# Patient Record
Sex: Female | Born: 1935 | Race: White | Hispanic: No | Marital: Married | Smoking: Former smoker
Health system: Southern US, Community
[De-identification: ages and names within clinical notes are randomized; demographics above are authoritative.]

## PROBLEM LIST (undated history)

## (undated) DIAGNOSIS — I714 Abdominal aortic aneurysm, without rupture, unspecified: Secondary | ICD-10-CM

## (undated) DIAGNOSIS — C3490 Malignant neoplasm of unspecified part of unspecified bronchus or lung: Secondary | ICD-10-CM

## (undated) DIAGNOSIS — I7090 Unspecified atherosclerosis: Secondary | ICD-10-CM

## (undated) DIAGNOSIS — E7800 Pure hypercholesterolemia, unspecified: Secondary | ICD-10-CM

## (undated) DIAGNOSIS — I10 Essential (primary) hypertension: Secondary | ICD-10-CM

## (undated) DIAGNOSIS — J449 Chronic obstructive pulmonary disease, unspecified: Secondary | ICD-10-CM

## (undated) DIAGNOSIS — M069 Rheumatoid arthritis, unspecified: Secondary | ICD-10-CM

## (undated) HISTORY — DX: Pure hypercholesterolemia, unspecified: E78.00

## (undated) HISTORY — PX: CHOLECYSTECTOMY: SHX55

## (undated) HISTORY — DX: Unspecified atherosclerosis: I70.90

## (undated) HISTORY — DX: Abdominal aortic aneurysm, without rupture, unspecified: I71.40

## (undated) HISTORY — DX: Rheumatoid arthritis, unspecified: M06.9

## (undated) HISTORY — DX: Chronic obstructive pulmonary disease, unspecified: J44.9

## (undated) HISTORY — PX: APPENDECTOMY: SHX54

## (undated) HISTORY — DX: Abdominal aortic aneurysm, without rupture: I71.4

---

## 1991-02-08 HISTORY — PX: RETINAL DETACHMENT SURGERY: SHX105

## 1998-03-30 ENCOUNTER — Encounter: Payer: Self-pay | Admitting: Internal Medicine

## 1998-03-30 ENCOUNTER — Ambulatory Visit (HOSPITAL_COMMUNITY): Admission: RE | Admit: 1998-03-30 | Discharge: 1998-03-30 | Payer: Self-pay | Admitting: Internal Medicine

## 1999-03-12 ENCOUNTER — Other Ambulatory Visit: Admission: RE | Admit: 1999-03-12 | Discharge: 1999-03-12 | Payer: Self-pay | Admitting: Internal Medicine

## 1999-04-19 ENCOUNTER — Ambulatory Visit (HOSPITAL_BASED_OUTPATIENT_CLINIC_OR_DEPARTMENT_OTHER): Admission: RE | Admit: 1999-04-19 | Discharge: 1999-04-19 | Payer: Self-pay | Admitting: Surgery

## 1999-05-31 ENCOUNTER — Encounter: Admission: RE | Admit: 1999-05-31 | Discharge: 1999-05-31 | Payer: Self-pay | Admitting: *Deleted

## 1999-08-16 ENCOUNTER — Ambulatory Visit (HOSPITAL_COMMUNITY): Admission: RE | Admit: 1999-08-16 | Discharge: 1999-08-16 | Payer: Self-pay

## 2000-01-19 ENCOUNTER — Ambulatory Visit (HOSPITAL_COMMUNITY): Admission: RE | Admit: 2000-01-19 | Discharge: 2000-01-19 | Payer: Self-pay | Admitting: Rheumatology

## 2000-01-19 ENCOUNTER — Encounter: Payer: Self-pay | Admitting: Rheumatology

## 2000-02-08 HISTORY — PX: OTHER SURGICAL HISTORY: SHX169

## 2000-02-18 ENCOUNTER — Ambulatory Visit (HOSPITAL_COMMUNITY): Admission: RE | Admit: 2000-02-18 | Discharge: 2000-02-19 | Payer: Self-pay

## 2000-02-18 ENCOUNTER — Encounter (INDEPENDENT_AMBULATORY_CARE_PROVIDER_SITE_OTHER): Payer: Self-pay | Admitting: Specialist

## 2000-12-25 ENCOUNTER — Encounter: Admission: RE | Admit: 2000-12-25 | Discharge: 2000-12-25 | Payer: Self-pay | Admitting: Neurosurgery

## 2000-12-25 ENCOUNTER — Encounter: Payer: Self-pay | Admitting: Neurosurgery

## 2002-02-07 HISTORY — PX: OTHER SURGICAL HISTORY: SHX169

## 2002-04-16 ENCOUNTER — Encounter: Payer: Self-pay | Admitting: Internal Medicine

## 2002-04-16 ENCOUNTER — Encounter: Admission: RE | Admit: 2002-04-16 | Discharge: 2002-04-16 | Payer: Self-pay | Admitting: Internal Medicine

## 2002-04-19 ENCOUNTER — Encounter: Payer: Self-pay | Admitting: Internal Medicine

## 2002-04-19 ENCOUNTER — Encounter: Admission: RE | Admit: 2002-04-19 | Discharge: 2002-04-19 | Payer: Self-pay | Admitting: Internal Medicine

## 2002-04-24 ENCOUNTER — Encounter: Payer: Self-pay | Admitting: Internal Medicine

## 2002-04-24 ENCOUNTER — Ambulatory Visit (HOSPITAL_COMMUNITY): Admission: RE | Admit: 2002-04-24 | Discharge: 2002-04-24 | Payer: Self-pay | Admitting: Internal Medicine

## 2002-06-05 ENCOUNTER — Encounter (INDEPENDENT_AMBULATORY_CARE_PROVIDER_SITE_OTHER): Payer: Self-pay | Admitting: *Deleted

## 2002-06-05 ENCOUNTER — Inpatient Hospital Stay (HOSPITAL_COMMUNITY): Admission: RE | Admit: 2002-06-05 | Discharge: 2002-06-11 | Payer: Self-pay | Admitting: Thoracic Surgery

## 2002-06-05 ENCOUNTER — Encounter: Payer: Self-pay | Admitting: Thoracic Surgery

## 2002-06-06 ENCOUNTER — Encounter: Payer: Self-pay | Admitting: Thoracic Surgery

## 2002-06-07 ENCOUNTER — Encounter: Payer: Self-pay | Admitting: Thoracic Surgery

## 2002-06-08 ENCOUNTER — Encounter: Payer: Self-pay | Admitting: Thoracic Surgery

## 2002-06-09 ENCOUNTER — Encounter: Payer: Self-pay | Admitting: Thoracic Surgery

## 2002-06-10 ENCOUNTER — Encounter: Payer: Self-pay | Admitting: Thoracic Surgery

## 2002-06-11 ENCOUNTER — Encounter: Payer: Self-pay | Admitting: Thoracic Surgery

## 2002-06-20 ENCOUNTER — Encounter: Payer: Self-pay | Admitting: Thoracic Surgery

## 2002-06-20 ENCOUNTER — Encounter: Admission: RE | Admit: 2002-06-20 | Discharge: 2002-06-20 | Payer: Self-pay | Admitting: Thoracic Surgery

## 2002-07-11 ENCOUNTER — Encounter: Payer: Self-pay | Admitting: Thoracic Surgery

## 2002-07-11 ENCOUNTER — Encounter: Admission: RE | Admit: 2002-07-11 | Discharge: 2002-07-11 | Payer: Self-pay | Admitting: Thoracic Surgery

## 2002-08-06 ENCOUNTER — Ambulatory Visit (HOSPITAL_COMMUNITY): Admission: RE | Admit: 2002-08-06 | Discharge: 2002-08-06 | Payer: Self-pay | Admitting: Thoracic Surgery

## 2002-08-06 ENCOUNTER — Encounter: Payer: Self-pay | Admitting: Thoracic Surgery

## 2002-08-09 ENCOUNTER — Encounter: Admission: RE | Admit: 2002-08-09 | Discharge: 2002-08-09 | Payer: Self-pay | Admitting: Thoracic Surgery

## 2002-08-09 ENCOUNTER — Encounter: Payer: Self-pay | Admitting: Thoracic Surgery

## 2002-10-10 ENCOUNTER — Encounter: Payer: Self-pay | Admitting: Thoracic Surgery

## 2002-10-10 ENCOUNTER — Encounter: Admission: RE | Admit: 2002-10-10 | Discharge: 2002-10-10 | Payer: Self-pay | Admitting: Thoracic Surgery

## 2002-12-11 ENCOUNTER — Encounter: Admission: RE | Admit: 2002-12-11 | Discharge: 2002-12-11 | Payer: Self-pay | Admitting: Thoracic Surgery

## 2003-01-23 ENCOUNTER — Encounter: Admission: RE | Admit: 2003-01-23 | Discharge: 2003-04-23 | Payer: Self-pay | Admitting: Internal Medicine

## 2003-04-10 ENCOUNTER — Encounter: Admission: RE | Admit: 2003-04-10 | Discharge: 2003-04-10 | Payer: Self-pay | Admitting: Thoracic Surgery

## 2003-09-11 ENCOUNTER — Encounter: Admission: RE | Admit: 2003-09-11 | Discharge: 2003-09-11 | Payer: Self-pay | Admitting: Thoracic Surgery

## 2003-09-23 ENCOUNTER — Encounter (INDEPENDENT_AMBULATORY_CARE_PROVIDER_SITE_OTHER): Payer: Self-pay | Admitting: *Deleted

## 2003-09-23 ENCOUNTER — Ambulatory Visit (HOSPITAL_COMMUNITY): Admission: RE | Admit: 2003-09-23 | Discharge: 2003-09-23 | Payer: Self-pay | Admitting: Thoracic Surgery

## 2004-03-16 ENCOUNTER — Encounter: Admission: RE | Admit: 2004-03-16 | Discharge: 2004-03-16 | Payer: Self-pay | Admitting: Thoracic Surgery

## 2004-06-06 ENCOUNTER — Encounter: Admission: RE | Admit: 2004-06-06 | Discharge: 2004-06-06 | Payer: Self-pay | Admitting: Neurosurgery

## 2004-09-15 ENCOUNTER — Encounter: Admission: RE | Admit: 2004-09-15 | Discharge: 2004-09-15 | Payer: Self-pay | Admitting: Thoracic Surgery

## 2005-03-22 ENCOUNTER — Encounter: Admission: RE | Admit: 2005-03-22 | Discharge: 2005-03-22 | Payer: Self-pay | Admitting: Thoracic Surgery

## 2005-09-21 ENCOUNTER — Encounter: Admission: RE | Admit: 2005-09-21 | Discharge: 2005-09-21 | Payer: Self-pay | Admitting: Thoracic Surgery

## 2006-04-04 ENCOUNTER — Encounter: Admission: RE | Admit: 2006-04-04 | Discharge: 2006-04-04 | Payer: Self-pay | Admitting: Thoracic Surgery

## 2006-04-04 ENCOUNTER — Ambulatory Visit: Payer: Self-pay | Admitting: Thoracic Surgery

## 2006-09-21 ENCOUNTER — Encounter: Admission: RE | Admit: 2006-09-21 | Discharge: 2006-09-21 | Payer: Self-pay | Admitting: Thoracic Surgery

## 2006-09-21 ENCOUNTER — Ambulatory Visit: Payer: Self-pay | Admitting: Thoracic Surgery

## 2007-03-29 ENCOUNTER — Ambulatory Visit: Payer: Self-pay | Admitting: Thoracic Surgery

## 2007-03-29 ENCOUNTER — Encounter: Admission: RE | Admit: 2007-03-29 | Discharge: 2007-03-29 | Payer: Self-pay | Admitting: Thoracic Surgery

## 2007-06-27 ENCOUNTER — Ambulatory Visit: Payer: Self-pay | Admitting: Thoracic Surgery

## 2007-06-27 ENCOUNTER — Encounter: Admission: RE | Admit: 2007-06-27 | Discharge: 2007-06-27 | Payer: Self-pay | Admitting: Thoracic Surgery

## 2007-12-11 ENCOUNTER — Encounter: Admission: RE | Admit: 2007-12-11 | Discharge: 2007-12-11 | Payer: Self-pay | Admitting: Thoracic Surgery

## 2007-12-11 ENCOUNTER — Ambulatory Visit: Payer: Self-pay | Admitting: Thoracic Surgery

## 2007-12-14 ENCOUNTER — Ambulatory Visit: Admission: RE | Admit: 2007-12-14 | Discharge: 2007-12-14 | Payer: Self-pay | Admitting: Thoracic Surgery

## 2007-12-19 ENCOUNTER — Ambulatory Visit: Payer: Self-pay | Admitting: Thoracic Surgery

## 2007-12-28 ENCOUNTER — Ambulatory Visit: Payer: Self-pay | Admitting: Thoracic Surgery

## 2007-12-28 ENCOUNTER — Ambulatory Visit (HOSPITAL_COMMUNITY): Admission: RE | Admit: 2007-12-28 | Discharge: 2007-12-28 | Payer: Self-pay | Admitting: Thoracic Surgery

## 2007-12-28 ENCOUNTER — Encounter: Payer: Self-pay | Admitting: Thoracic Surgery

## 2008-01-01 ENCOUNTER — Ambulatory Visit: Payer: Self-pay | Admitting: Internal Medicine

## 2008-01-01 ENCOUNTER — Ambulatory Visit: Payer: Self-pay | Admitting: Thoracic Surgery

## 2008-01-09 ENCOUNTER — Ambulatory Visit: Admission: RE | Admit: 2008-01-09 | Discharge: 2008-04-08 | Payer: Self-pay | Admitting: Radiation Oncology

## 2008-01-09 LAB — CBC WITH DIFFERENTIAL/PLATELET
BASO%: 0.6 % (ref 0.0–2.0)
Basophils Absolute: 0 10*3/uL (ref 0.0–0.1)
EOS%: 3.2 % (ref 0.0–7.0)
MCH: 29 pg (ref 26.0–34.0)
MCHC: 33.5 g/dL (ref 32.0–36.0)
MCV: 86.4 fL (ref 81.0–101.0)
MONO%: 7.2 % (ref 0.0–13.0)
RBC: 4.43 10*6/uL (ref 3.70–5.32)
RDW: 14.3 % (ref 11.3–14.5)
lymph#: 1.9 10*3/uL (ref 0.9–3.3)

## 2008-01-09 LAB — COMPREHENSIVE METABOLIC PANEL
ALT: 11 U/L (ref 0–35)
AST: 13 U/L (ref 0–37)
Albumin: 4.3 g/dL (ref 3.5–5.2)
Alkaline Phosphatase: 68 U/L (ref 39–117)
Potassium: 3.8 mEq/L (ref 3.5–5.3)
Sodium: 141 mEq/L (ref 135–145)
Total Bilirubin: 0.5 mg/dL (ref 0.3–1.2)
Total Protein: 6.6 g/dL (ref 6.0–8.3)

## 2008-01-14 LAB — CBC WITH DIFFERENTIAL/PLATELET
EOS%: 3 % (ref 0.0–7.0)
Eosinophils Absolute: 0.3 10*3/uL (ref 0.0–0.5)
LYMPH%: 21 % (ref 14.0–48.0)
MCH: 28.9 pg (ref 26.0–34.0)
MCV: 84.1 fL (ref 81.0–101.0)
MONO%: 7.2 % (ref 0.0–13.0)
NEUT#: 7 10*3/uL — ABNORMAL HIGH (ref 1.5–6.5)
Platelets: 328 10*3/uL (ref 145–400)
RBC: 4.6 10*6/uL (ref 3.70–5.32)
RDW: 13.9 % (ref 11.3–14.5)

## 2008-01-14 LAB — COMPREHENSIVE METABOLIC PANEL
AST: 11 U/L (ref 0–37)
Alkaline Phosphatase: 69 U/L (ref 39–117)
BUN: 28 mg/dL — ABNORMAL HIGH (ref 6–23)
Glucose, Bld: 128 mg/dL — ABNORMAL HIGH (ref 70–99)
Sodium: 139 mEq/L (ref 135–145)
Total Bilirubin: 0.5 mg/dL (ref 0.3–1.2)
Total Protein: 6.9 g/dL (ref 6.0–8.3)

## 2008-01-21 LAB — COMPREHENSIVE METABOLIC PANEL
Alkaline Phosphatase: 73 U/L (ref 39–117)
BUN: 43 mg/dL — ABNORMAL HIGH (ref 6–23)
Creatinine, Ser: 1.29 mg/dL — ABNORMAL HIGH (ref 0.40–1.20)
Glucose, Bld: 139 mg/dL — ABNORMAL HIGH (ref 70–99)
Total Bilirubin: 1.3 mg/dL — ABNORMAL HIGH (ref 0.3–1.2)

## 2008-01-21 LAB — CBC WITH DIFFERENTIAL/PLATELET
Basophils Absolute: 0 10*3/uL (ref 0.0–0.1)
Eosinophils Absolute: 0.1 10*3/uL (ref 0.0–0.5)
HGB: 12.4 g/dL (ref 11.6–15.9)
LYMPH%: 83.3 % — ABNORMAL HIGH (ref 14.0–48.0)
MCH: 29.5 pg (ref 26.0–34.0)
MCV: 85 fL (ref 81.0–101.0)
MONO%: 1.4 % (ref 0.0–13.0)
NEUT#: 0.1 10*3/uL — CL (ref 1.5–6.5)
Platelets: 123 10*3/uL — ABNORMAL LOW (ref 145–400)

## 2008-01-23 ENCOUNTER — Ambulatory Visit: Payer: Self-pay | Admitting: Thoracic Surgery

## 2008-01-23 ENCOUNTER — Encounter: Admission: RE | Admit: 2008-01-23 | Discharge: 2008-01-23 | Payer: Self-pay | Admitting: Thoracic Surgery

## 2008-01-28 LAB — COMPREHENSIVE METABOLIC PANEL
AST: 20 U/L (ref 0–37)
Albumin: 4 g/dL (ref 3.5–5.2)
Alkaline Phosphatase: 69 U/L (ref 39–117)
Glucose, Bld: 125 mg/dL — ABNORMAL HIGH (ref 70–99)
Potassium: 4.3 mEq/L (ref 3.5–5.3)
Sodium: 140 mEq/L (ref 135–145)
Total Protein: 6.7 g/dL (ref 6.0–8.3)

## 2008-01-28 LAB — CBC WITH DIFFERENTIAL/PLATELET
Eosinophils Absolute: 0.1 10*3/uL (ref 0.0–0.5)
MCV: 84.1 fL (ref 81.0–101.0)
MONO%: 5.2 % (ref 0.0–13.0)
NEUT#: 7.5 10*3/uL — ABNORMAL HIGH (ref 1.5–6.5)
RBC: 3.82 10*6/uL (ref 3.70–5.32)
RDW: 13.6 % (ref 11.3–14.5)
WBC: 11.3 10*3/uL — ABNORMAL HIGH (ref 3.9–10.0)

## 2008-02-04 LAB — COMPREHENSIVE METABOLIC PANEL
AST: 13 U/L (ref 0–37)
Alkaline Phosphatase: 66 U/L (ref 39–117)
BUN: 22 mg/dL (ref 6–23)
Creatinine, Ser: 1.28 mg/dL — ABNORMAL HIGH (ref 0.40–1.20)
Potassium: 4.4 mEq/L (ref 3.5–5.3)
Total Bilirubin: 0.4 mg/dL (ref 0.3–1.2)

## 2008-02-04 LAB — CBC WITH DIFFERENTIAL/PLATELET
Basophils Absolute: 0.1 10*3/uL (ref 0.0–0.1)
EOS%: 0.2 % (ref 0.0–7.0)
HCT: 30.1 % — ABNORMAL LOW (ref 34.8–46.6)
HGB: 10.6 g/dL — ABNORMAL LOW (ref 11.6–15.9)
MCH: 28.8 pg (ref 26.0–34.0)
MCV: 82 fL (ref 81.0–101.0)
MONO%: 8 % (ref 0.0–13.0)
NEUT%: 73.3 % (ref 39.6–76.8)
Platelets: 412 10*3/uL — ABNORMAL HIGH (ref 145–400)

## 2008-02-05 ENCOUNTER — Ambulatory Visit: Payer: Self-pay | Admitting: Internal Medicine

## 2008-02-05 ENCOUNTER — Inpatient Hospital Stay (HOSPITAL_COMMUNITY): Admission: AD | Admit: 2008-02-05 | Discharge: 2008-02-07 | Payer: Self-pay | Admitting: Internal Medicine

## 2008-02-05 ENCOUNTER — Ambulatory Visit: Payer: Self-pay | Admitting: Cardiology

## 2008-02-06 ENCOUNTER — Encounter: Payer: Self-pay | Admitting: Internal Medicine

## 2008-02-11 ENCOUNTER — Ambulatory Visit: Payer: Self-pay | Admitting: Internal Medicine

## 2008-02-11 LAB — CBC WITH DIFFERENTIAL/PLATELET
Eosinophils Absolute: 0 10*3/uL (ref 0.0–0.5)
MCV: 85.8 fL (ref 81.0–101.0)
MONO#: 0 10*3/uL — ABNORMAL LOW (ref 0.1–0.9)
MONO%: 0.2 % (ref 0.0–13.0)
NEUT#: 3.2 10*3/uL (ref 1.5–6.5)
RBC: 4.12 10*6/uL (ref 3.70–5.32)
RDW: 14.3 % (ref 11.3–14.5)
WBC: 3.9 10*3/uL (ref 3.9–10.0)
lymph#: 0.7 10*3/uL — ABNORMAL LOW (ref 0.9–3.3)

## 2008-02-11 LAB — COMPREHENSIVE METABOLIC PANEL
Albumin: 4.1 g/dL (ref 3.5–5.2)
BUN: 25 mg/dL — ABNORMAL HIGH (ref 6–23)
CO2: 31 mEq/L (ref 19–32)
Calcium: 9.4 mg/dL (ref 8.4–10.5)
Chloride: 100 mEq/L (ref 96–112)
Glucose, Bld: 126 mg/dL — ABNORMAL HIGH (ref 70–99)
Potassium: 4.2 mEq/L (ref 3.5–5.3)

## 2008-02-18 LAB — CBC WITH DIFFERENTIAL/PLATELET
Basophils Absolute: 0 10*3/uL (ref 0.0–0.1)
EOS%: 0.1 % (ref 0.0–7.0)
Eosinophils Absolute: 0 10*3/uL (ref 0.0–0.5)
HGB: 10.2 g/dL — ABNORMAL LOW (ref 11.6–15.9)
NEUT#: 14.9 10*3/uL — ABNORMAL HIGH (ref 1.5–6.5)
RBC: 3.46 10*6/uL — ABNORMAL LOW (ref 3.70–5.32)
RDW: 14 % (ref 11.3–14.5)
WBC: 18 10*3/uL — ABNORMAL HIGH (ref 3.9–10.0)
lymph#: 2 10*3/uL (ref 0.9–3.3)

## 2008-02-18 LAB — COMPREHENSIVE METABOLIC PANEL
AST: 14 U/L (ref 0–37)
Albumin: 4.3 g/dL (ref 3.5–5.2)
BUN: 23 mg/dL (ref 6–23)
Calcium: 8.8 mg/dL (ref 8.4–10.5)
Chloride: 99 mEq/L (ref 96–112)
Glucose, Bld: 127 mg/dL — ABNORMAL HIGH (ref 70–99)
Potassium: 3.8 mEq/L (ref 3.5–5.3)
Sodium: 143 mEq/L (ref 135–145)
Total Protein: 6.6 g/dL (ref 6.0–8.3)

## 2008-02-25 ENCOUNTER — Encounter (HOSPITAL_COMMUNITY): Admission: RE | Admit: 2008-02-25 | Discharge: 2008-05-25 | Payer: Self-pay | Admitting: Internal Medicine

## 2008-02-25 LAB — COMPREHENSIVE METABOLIC PANEL
ALT: 13 U/L (ref 0–35)
AST: 13 U/L (ref 0–37)
Albumin: 3.9 g/dL (ref 3.5–5.2)
Alkaline Phosphatase: 79 U/L (ref 39–117)
BUN: 18 mg/dL (ref 6–23)
Calcium: 9.2 mg/dL (ref 8.4–10.5)
Chloride: 93 mEq/L — ABNORMAL LOW (ref 96–112)
Potassium: 3.7 mEq/L (ref 3.5–5.3)
Sodium: 140 mEq/L (ref 135–145)
Total Protein: 6.8 g/dL (ref 6.0–8.3)

## 2008-02-25 LAB — CBC WITH DIFFERENTIAL/PLATELET
BASO%: 1.7 % (ref 0.0–2.0)
Basophils Absolute: 0.2 10*3/uL — ABNORMAL HIGH (ref 0.0–0.1)
EOS%: 0.1 % (ref 0.0–7.0)
HGB: 8.4 g/dL — ABNORMAL LOW (ref 11.6–15.9)
MCH: 29.1 pg (ref 26.0–34.0)
MCV: 83.5 fL (ref 81.0–101.0)
MONO%: 4.6 % (ref 0.0–13.0)
RBC: 2.88 10*6/uL — ABNORMAL LOW (ref 3.70–5.32)
RDW: 13.9 % (ref 11.3–14.5)
lymph#: 0.7 10*3/uL — ABNORMAL LOW (ref 0.9–3.3)

## 2008-03-04 ENCOUNTER — Emergency Department (HOSPITAL_COMMUNITY): Admission: EM | Admit: 2008-03-04 | Discharge: 2008-03-04 | Payer: Self-pay | Admitting: Emergency Medicine

## 2008-03-05 ENCOUNTER — Inpatient Hospital Stay (HOSPITAL_COMMUNITY): Admission: EM | Admit: 2008-03-05 | Discharge: 2008-03-14 | Payer: Self-pay

## 2008-03-06 ENCOUNTER — Ambulatory Visit: Payer: Self-pay | Admitting: Oncology

## 2008-03-18 LAB — CBC WITH DIFFERENTIAL/PLATELET
Basophils Absolute: 0 10*3/uL (ref 0.0–0.1)
Eosinophils Absolute: 0 10*3/uL (ref 0.0–0.5)
HGB: 8.7 g/dL — ABNORMAL LOW (ref 11.6–15.9)
MONO#: 0.5 10*3/uL (ref 0.1–0.9)
NEUT#: 1.6 10*3/uL (ref 1.5–6.5)
Platelets: 76 10*3/uL — ABNORMAL LOW (ref 145–400)
RBC: 2.79 10*6/uL — ABNORMAL LOW (ref 3.70–5.32)
RDW: 14.2 % (ref 11.3–14.5)
WBC: 3 10*3/uL — ABNORMAL LOW (ref 3.9–10.0)

## 2008-03-18 LAB — COMPREHENSIVE METABOLIC PANEL
Albumin: 3.1 g/dL — ABNORMAL LOW (ref 3.5–5.2)
BUN: 17 mg/dL (ref 6–23)
CO2: 37 mEq/L — ABNORMAL HIGH (ref 19–32)
Calcium: 9.8 mg/dL (ref 8.4–10.5)
Glucose, Bld: 123 mg/dL — ABNORMAL HIGH (ref 70–99)
Potassium: 3.3 mEq/L — ABNORMAL LOW (ref 3.5–5.3)
Sodium: 140 mEq/L (ref 135–145)
Total Protein: 6 g/dL (ref 6.0–8.3)

## 2008-03-24 LAB — COMPREHENSIVE METABOLIC PANEL
ALT: 18 U/L (ref 0–35)
BUN: 17 mg/dL (ref 6–23)
CO2: 34 mEq/L — ABNORMAL HIGH (ref 19–32)
Calcium: 10.2 mg/dL (ref 8.4–10.5)
Creatinine, Ser: 0.93 mg/dL (ref 0.40–1.20)
Total Bilirubin: 0.9 mg/dL (ref 0.3–1.2)

## 2008-03-24 LAB — CBC WITH DIFFERENTIAL/PLATELET
BASO%: 0.4 % (ref 0.0–2.0)
Basophils Absolute: 0 10*3/uL (ref 0.0–0.1)
HCT: 22.3 % — ABNORMAL LOW (ref 34.8–46.6)
HGB: 7.9 g/dL — ABNORMAL LOW (ref 11.6–15.9)
LYMPH%: 18.4 % (ref 14.0–48.0)
MCH: 31.4 pg (ref 26.0–34.0)
MCHC: 35.7 g/dL (ref 32.0–36.0)
MONO#: 0.5 10*3/uL (ref 0.1–0.9)
NEUT%: 69.3 % (ref 39.6–76.8)
Platelets: 263 10*3/uL (ref 145–400)
WBC: 4.2 10*3/uL (ref 3.9–10.0)

## 2008-03-24 LAB — TYPE & CROSSMATCH - CHCC

## 2008-03-31 ENCOUNTER — Ambulatory Visit: Payer: Self-pay | Admitting: Internal Medicine

## 2008-03-31 LAB — COMPREHENSIVE METABOLIC PANEL
Albumin: 3.8 g/dL (ref 3.5–5.2)
Alkaline Phosphatase: 71 U/L (ref 39–117)
BUN: 18 mg/dL (ref 6–23)
CO2: 31 mEq/L (ref 19–32)
Glucose, Bld: 110 mg/dL — ABNORMAL HIGH (ref 70–99)
Sodium: 139 mEq/L (ref 135–145)
Total Bilirubin: 1 mg/dL (ref 0.3–1.2)
Total Protein: 6.6 g/dL (ref 6.0–8.3)

## 2008-03-31 LAB — CBC WITH DIFFERENTIAL/PLATELET
Basophils Absolute: 0 10*3/uL (ref 0.0–0.1)
EOS%: 0.7 % (ref 0.0–7.0)
Eosinophils Absolute: 0 10*3/uL (ref 0.0–0.5)
HCT: 30 % — ABNORMAL LOW (ref 34.8–46.6)
HGB: 10.1 g/dL — ABNORMAL LOW (ref 11.6–15.9)
MCH: 29.7 pg (ref 25.1–34.0)
MCV: 88.2 fL (ref 79.5–101.0)
MONO%: 14.4 % — ABNORMAL HIGH (ref 0.0–14.0)
NEUT#: 4.6 10*3/uL (ref 1.5–6.5)
NEUT%: 76.7 % (ref 38.4–76.8)
Platelets: 136 10*3/uL — ABNORMAL LOW (ref 145–400)
RDW: 17.1 % — ABNORMAL HIGH (ref 11.2–14.5)

## 2008-04-02 ENCOUNTER — Ambulatory Visit: Payer: Self-pay | Admitting: Thoracic Surgery

## 2008-04-07 LAB — CBC WITH DIFFERENTIAL/PLATELET
BASO%: 1.1 % (ref 0.0–2.0)
LYMPH%: 60.8 % — ABNORMAL HIGH (ref 14.0–49.7)
MCHC: 35.8 g/dL (ref 31.5–36.0)
MONO#: 0 10*3/uL — ABNORMAL LOW (ref 0.1–0.9)
MONO%: 2.5 % (ref 0.0–14.0)
Platelets: 49 10*3/uL — ABNORMAL LOW (ref 145–400)
RBC: 2.6 10*6/uL — ABNORMAL LOW (ref 3.70–5.45)
WBC: 0.9 10*3/uL — CL (ref 3.9–10.3)

## 2008-04-07 LAB — COMPREHENSIVE METABOLIC PANEL
ALT: 17 U/L (ref 0–35)
AST: 17 U/L (ref 0–37)
Alkaline Phosphatase: 76 U/L (ref 39–117)
Calcium: 10 mg/dL (ref 8.4–10.5)
Chloride: 99 mEq/L (ref 96–112)
Creatinine, Ser: 0.83 mg/dL (ref 0.40–1.20)
Total Bilirubin: 1.8 mg/dL — ABNORMAL HIGH (ref 0.3–1.2)

## 2008-04-17 ENCOUNTER — Ambulatory Visit (HOSPITAL_COMMUNITY): Admission: RE | Admit: 2008-04-17 | Discharge: 2008-04-17 | Payer: Self-pay | Admitting: Internal Medicine

## 2008-04-21 ENCOUNTER — Ambulatory Visit: Payer: Self-pay | Admitting: Internal Medicine

## 2008-04-21 ENCOUNTER — Inpatient Hospital Stay (HOSPITAL_COMMUNITY): Admission: AD | Admit: 2008-04-21 | Discharge: 2008-04-22 | Payer: Self-pay | Admitting: Internal Medicine

## 2008-04-21 LAB — CBC WITH DIFFERENTIAL/PLATELET
BASO%: 0 % (ref 0.0–2.0)
EOS%: 0.5 % (ref 0.0–7.0)
HCT: 14.2 % — ABNORMAL LOW (ref 34.8–46.6)
LYMPH%: 16.4 % (ref 14.0–49.7)
MCH: 32.2 pg (ref 25.1–34.0)
MCHC: 35.4 g/dL (ref 31.5–36.0)
MCV: 91.1 fL (ref 79.5–101.0)
MONO%: 12.1 % (ref 0.0–14.0)
NEUT%: 71 % (ref 38.4–76.8)
Platelets: 157 10*3/uL (ref 145–400)

## 2008-04-21 LAB — COMPREHENSIVE METABOLIC PANEL
ALT: 9 U/L (ref 0–35)
AST: 12 U/L (ref 0–37)
BUN: 16 mg/dL (ref 6–23)
Creatinine, Ser: 0.83 mg/dL (ref 0.40–1.20)
Total Bilirubin: 0.6 mg/dL (ref 0.3–1.2)

## 2008-05-28 ENCOUNTER — Ambulatory Visit: Payer: Self-pay | Admitting: Thoracic Surgery

## 2008-06-16 ENCOUNTER — Ambulatory Visit: Payer: Self-pay | Admitting: Internal Medicine

## 2008-06-18 ENCOUNTER — Ambulatory Visit (HOSPITAL_COMMUNITY): Admission: RE | Admit: 2008-06-18 | Discharge: 2008-06-18 | Payer: Self-pay | Admitting: Internal Medicine

## 2008-06-18 LAB — COMPREHENSIVE METABOLIC PANEL
ALT: 16 U/L (ref 0–35)
Albumin: 3.6 g/dL (ref 3.5–5.2)
CO2: 33 mEq/L — ABNORMAL HIGH (ref 19–32)
Chloride: 102 mEq/L (ref 96–112)
Potassium: 3.9 mEq/L (ref 3.5–5.3)
Sodium: 141 mEq/L (ref 135–145)
Total Bilirubin: 0.5 mg/dL (ref 0.3–1.2)
Total Protein: 6.3 g/dL (ref 6.0–8.3)

## 2008-06-18 LAB — CBC WITH DIFFERENTIAL/PLATELET
BASO%: 0.3 % (ref 0.0–2.0)
LYMPH%: 20.5 % (ref 14.0–49.7)
MCHC: 34.8 g/dL (ref 31.5–36.0)
MONO#: 0.6 10*3/uL (ref 0.1–0.9)
NEUT#: 3.1 10*3/uL (ref 1.5–6.5)
RBC: 3.05 10*6/uL — ABNORMAL LOW (ref 3.70–5.45)
RDW: 14.2 % (ref 11.2–14.5)
WBC: 4.9 10*3/uL (ref 3.9–10.3)
lymph#: 1 10*3/uL (ref 0.9–3.3)

## 2008-06-21 ENCOUNTER — Ambulatory Visit: Payer: Self-pay | Admitting: Internal Medicine

## 2008-09-12 ENCOUNTER — Ambulatory Visit: Payer: Self-pay | Admitting: Internal Medicine

## 2008-09-16 ENCOUNTER — Ambulatory Visit (HOSPITAL_COMMUNITY): Admission: RE | Admit: 2008-09-16 | Discharge: 2008-09-16 | Payer: Self-pay | Admitting: Internal Medicine

## 2008-09-16 LAB — COMPREHENSIVE METABOLIC PANEL
ALT: 13 U/L (ref 0–35)
Albumin: 3.7 g/dL (ref 3.5–5.2)
CO2: 35 mEq/L — ABNORMAL HIGH (ref 19–32)
Calcium: 9.9 mg/dL (ref 8.4–10.5)
Chloride: 101 mEq/L (ref 96–112)
Glucose, Bld: 123 mg/dL — ABNORMAL HIGH (ref 70–99)
Sodium: 139 mEq/L (ref 135–145)
Total Protein: 6.5 g/dL (ref 6.0–8.3)

## 2008-09-16 LAB — CBC WITH DIFFERENTIAL/PLATELET
BASO%: 0.4 % (ref 0.0–2.0)
Eosinophils Absolute: 0.1 10*3/uL (ref 0.0–0.5)
HCT: 31.8 % — ABNORMAL LOW (ref 34.8–46.6)
MCHC: 34.5 g/dL (ref 31.5–36.0)
MONO#: 0.6 10*3/uL (ref 0.1–0.9)
NEUT#: 3.8 10*3/uL (ref 1.5–6.5)
Platelets: 162 10*3/uL (ref 145–400)
RBC: 3.39 10*6/uL — ABNORMAL LOW (ref 3.70–5.45)
WBC: 5.6 10*3/uL (ref 3.9–10.3)
lymph#: 1 10*3/uL (ref 0.9–3.3)

## 2008-09-17 ENCOUNTER — Ambulatory Visit: Payer: Self-pay | Admitting: Thoracic Surgery

## 2008-09-23 ENCOUNTER — Encounter: Payer: Self-pay | Admitting: Cardiology

## 2008-12-12 ENCOUNTER — Ambulatory Visit: Payer: Self-pay | Admitting: Internal Medicine

## 2008-12-16 ENCOUNTER — Ambulatory Visit (HOSPITAL_COMMUNITY): Admission: RE | Admit: 2008-12-16 | Discharge: 2008-12-16 | Payer: Self-pay | Admitting: Internal Medicine

## 2008-12-16 LAB — COMPREHENSIVE METABOLIC PANEL
ALT: 16 U/L (ref 0–35)
AST: 19 U/L (ref 0–37)
Albumin: 4 g/dL (ref 3.5–5.2)
Alkaline Phosphatase: 66 U/L (ref 39–117)
Calcium: 10.4 mg/dL (ref 8.4–10.5)
Chloride: 100 mEq/L (ref 96–112)
Potassium: 3.9 mEq/L (ref 3.5–5.3)
Sodium: 138 mEq/L (ref 135–145)
Total Protein: 6.9 g/dL (ref 6.0–8.3)

## 2008-12-16 LAB — CBC WITH DIFFERENTIAL/PLATELET
BASO%: 0.5 % (ref 0.0–2.0)
Basophils Absolute: 0 10*3/uL (ref 0.0–0.1)
EOS%: 1.8 % (ref 0.0–7.0)
HGB: 11.9 g/dL (ref 11.6–15.9)
MCH: 32.4 pg (ref 25.1–34.0)
MCHC: 34.1 g/dL (ref 31.5–36.0)
MONO%: 9.2 % (ref 0.0–14.0)
RBC: 3.68 10*6/uL — ABNORMAL LOW (ref 3.70–5.45)
RDW: 14.2 % (ref 11.2–14.5)
lymph#: 1.4 10*3/uL (ref 0.9–3.3)

## 2008-12-23 ENCOUNTER — Ambulatory Visit: Payer: Self-pay | Admitting: Thoracic Surgery

## 2008-12-24 ENCOUNTER — Encounter: Payer: Self-pay | Admitting: Cardiology

## 2009-04-17 ENCOUNTER — Ambulatory Visit: Payer: Self-pay | Admitting: Internal Medicine

## 2009-04-21 ENCOUNTER — Ambulatory Visit (HOSPITAL_COMMUNITY): Admission: RE | Admit: 2009-04-21 | Discharge: 2009-04-21 | Payer: Self-pay | Admitting: Internal Medicine

## 2009-04-21 LAB — COMPREHENSIVE METABOLIC PANEL
ALT: 15 U/L (ref 0–35)
AST: 17 U/L (ref 0–37)
Albumin: 3.9 g/dL (ref 3.5–5.2)
Alkaline Phosphatase: 71 U/L (ref 39–117)
Potassium: 3.6 mEq/L (ref 3.5–5.3)
Sodium: 143 mEq/L (ref 135–145)
Total Protein: 6.9 g/dL (ref 6.0–8.3)

## 2009-04-21 LAB — CBC WITH DIFFERENTIAL/PLATELET
BASO%: 0.4 % (ref 0.0–2.0)
Basophils Absolute: 0 10*3/uL (ref 0.0–0.1)
HCT: 37 % (ref 34.8–46.6)
HGB: 12.7 g/dL (ref 11.6–15.9)
MCHC: 34.3 g/dL (ref 31.5–36.0)
NEUT%: 65.7 % (ref 38.4–76.8)
Platelets: 182 10*3/uL (ref 145–400)
RDW: 14.1 % (ref 11.2–14.5)
lymph#: 1.3 10*3/uL (ref 0.9–3.3)

## 2009-04-24 ENCOUNTER — Ambulatory Visit: Payer: Self-pay | Admitting: Thoracic Surgery

## 2009-10-14 ENCOUNTER — Ambulatory Visit: Payer: Self-pay | Admitting: Internal Medicine

## 2009-10-19 ENCOUNTER — Ambulatory Visit (HOSPITAL_COMMUNITY): Admission: RE | Admit: 2009-10-19 | Discharge: 2009-10-19 | Payer: Self-pay | Admitting: Internal Medicine

## 2009-10-19 LAB — CBC WITH DIFFERENTIAL/PLATELET
BASO%: 0.5 % (ref 0.0–2.0)
Basophils Absolute: 0 10*3/uL (ref 0.0–0.1)
HGB: 12.4 g/dL (ref 11.6–15.9)
LYMPH%: 24.3 % (ref 14.0–49.7)
MCH: 30.6 pg (ref 25.1–34.0)
MCHC: 33.1 g/dL (ref 31.5–36.0)
MCV: 92.4 fL (ref 79.5–101.0)
NEUT%: 62.2 % (ref 38.4–76.8)
Platelets: 171 10*3/uL (ref 145–400)
WBC: 4.9 10*3/uL (ref 3.9–10.3)

## 2009-10-19 LAB — COMPREHENSIVE METABOLIC PANEL
ALT: 15 U/L (ref 0–35)
BUN: 22 mg/dL (ref 6–23)
CO2: 30 mEq/L (ref 19–32)
Calcium: 9.9 mg/dL (ref 8.4–10.5)
Creatinine, Ser: 0.94 mg/dL (ref 0.40–1.20)
Potassium: 3.8 mEq/L (ref 3.5–5.3)
Sodium: 141 mEq/L (ref 135–145)
Total Bilirubin: 0.8 mg/dL (ref 0.3–1.2)

## 2009-10-21 ENCOUNTER — Ambulatory Visit: Payer: Self-pay | Admitting: Thoracic Surgery

## 2009-11-20 IMAGING — CT CT CHEST W/O CM
2 of 3 series · 15 of 36 positions shown, 18 images · IV contrast (agent unspecified)
Comparison: 09/21/06.

CLINICAL DATA: Left upper lobectomy for lung cancer, followup. 
 CHEST CT WITHOUT CONTRAST:
TECHNIQUE: Multidetector CT imaging of the chest was performed following the standard protocol without IV contrast.

[Series 3: routine chest · axial · 0.70mm/px · z∈[-281,-36]mm · 12 of 59 slices shown, 15 images]
[im 5/59  mediastinal]
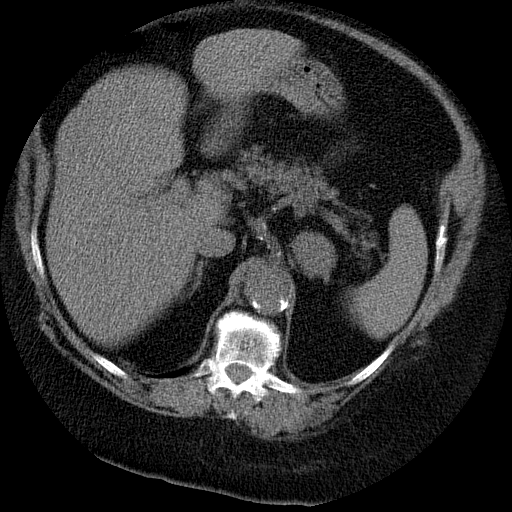
[im 5/59  lung]
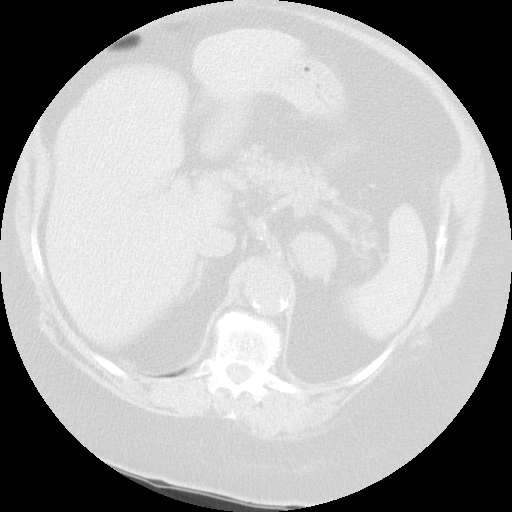
[im 9/59  lung]
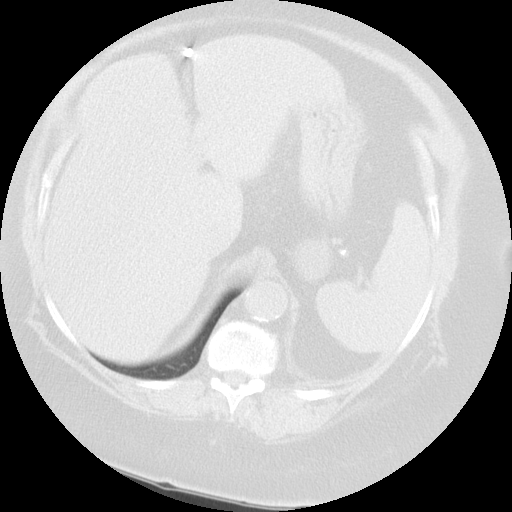
[im 13/59  lung]
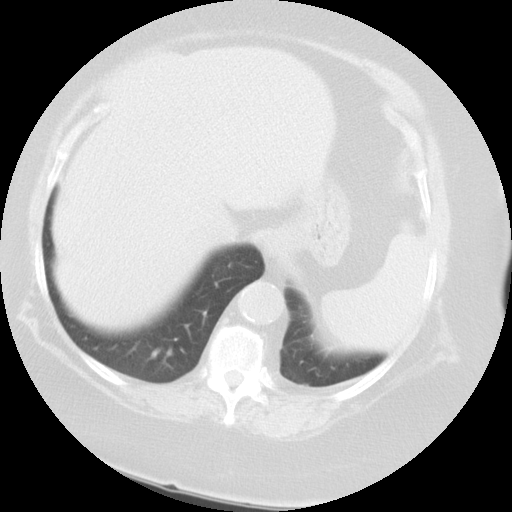
[im 18/59  lung]
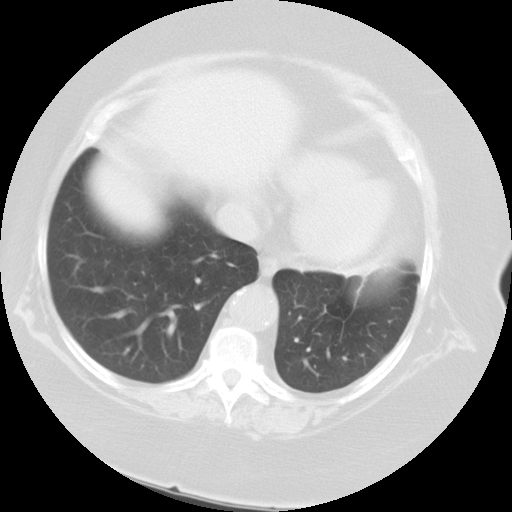
[im 22/59  mediastinal]
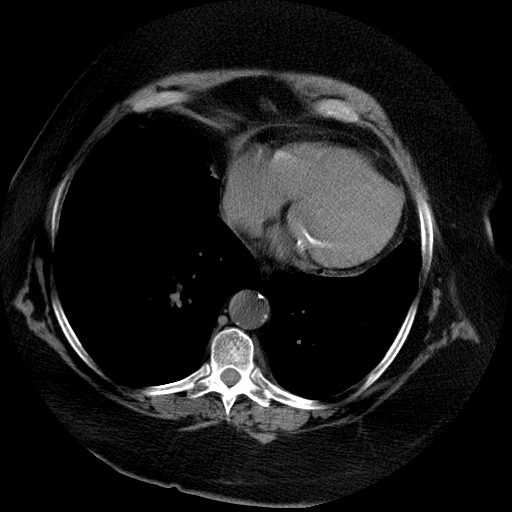
[im 22/59  lung]
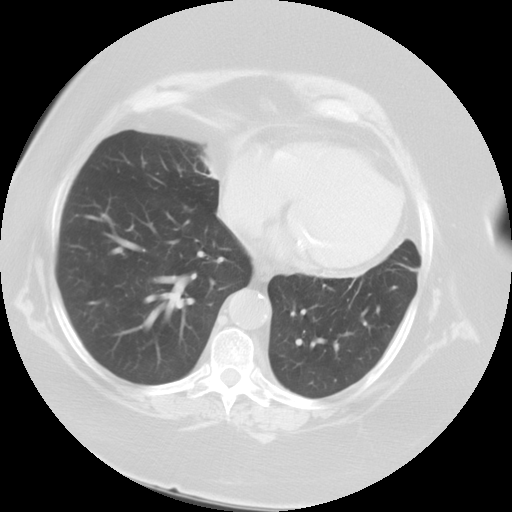
[im 26/59  lung]
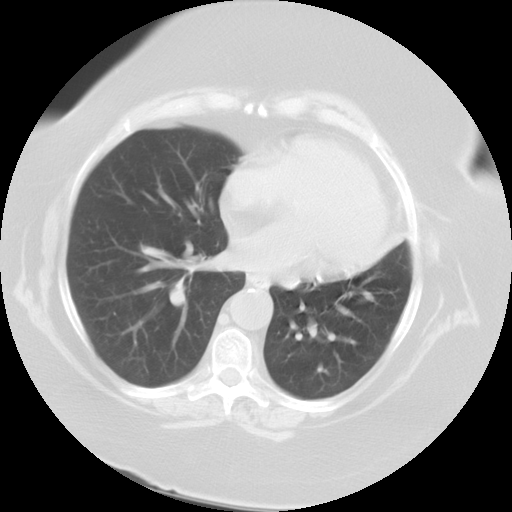
[im 33/59  lung]
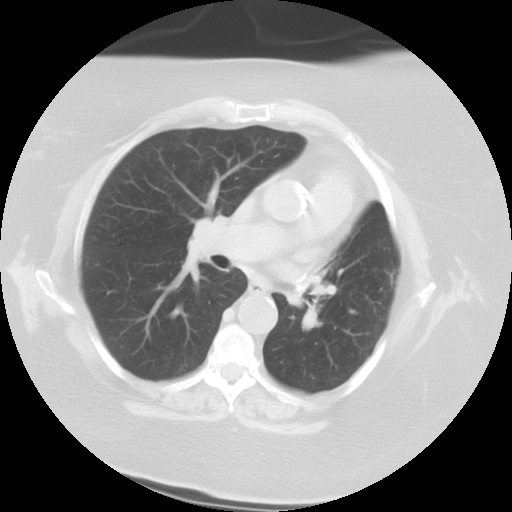
[im 37/59  lung]
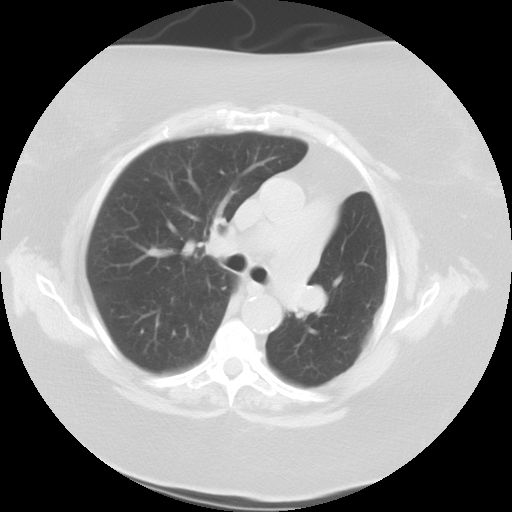
[im 41/59  mediastinal]
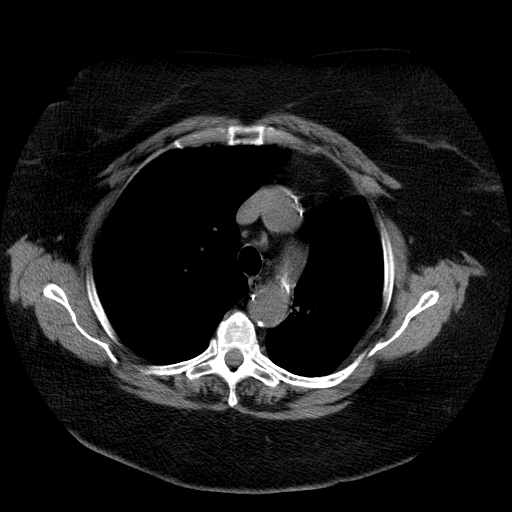
[im 41/59  lung]
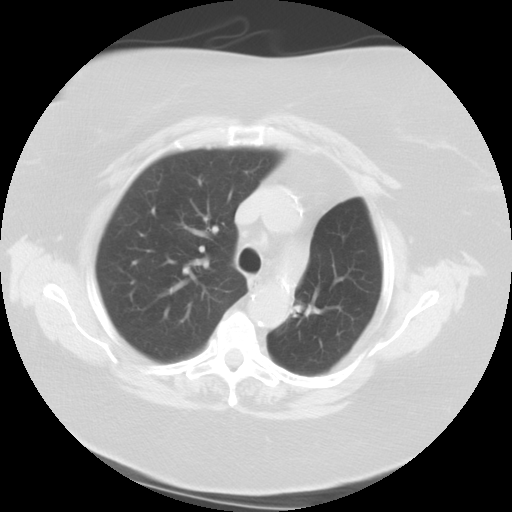
[im 46/59  lung]
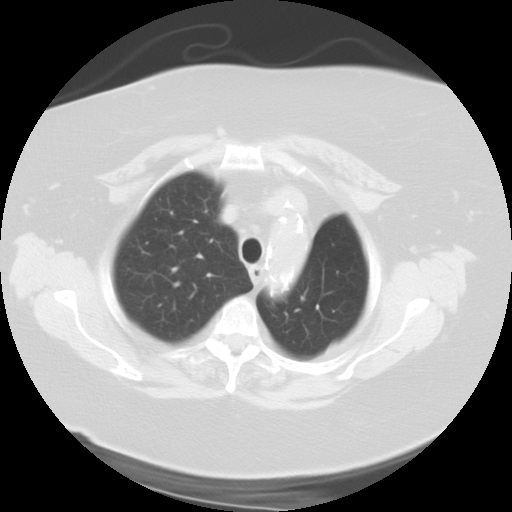
[im 50/59  lung]
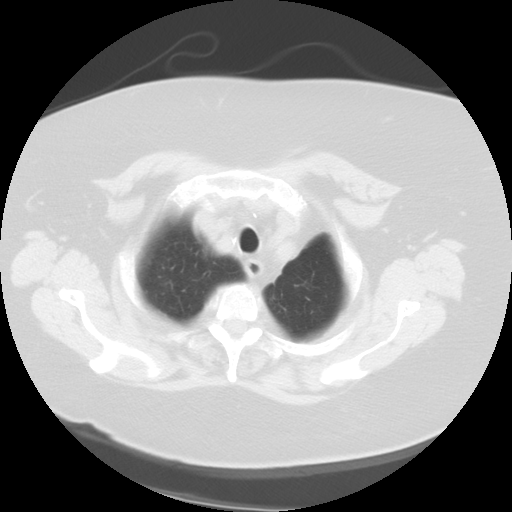
[im 54/59  lung]
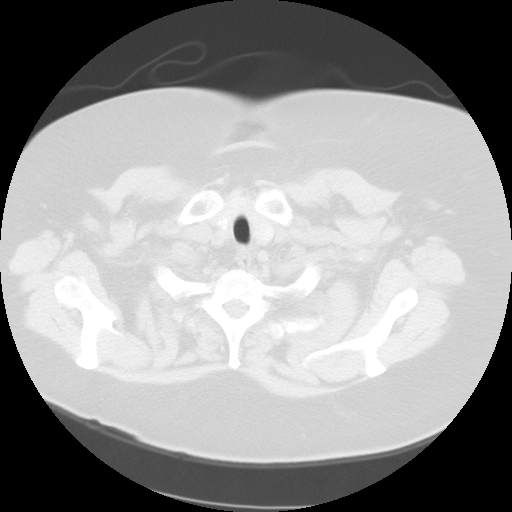

[Series 602: sagittal body · sagittal · 0.70mm/px · 3 of 145 slices shown]
[im 29/145  lung]
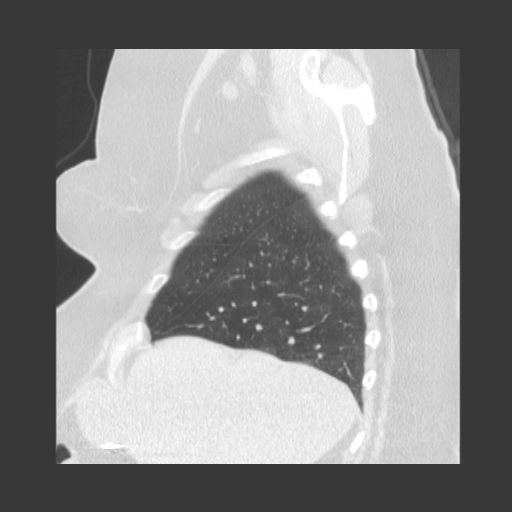
[im 58/145  lung]
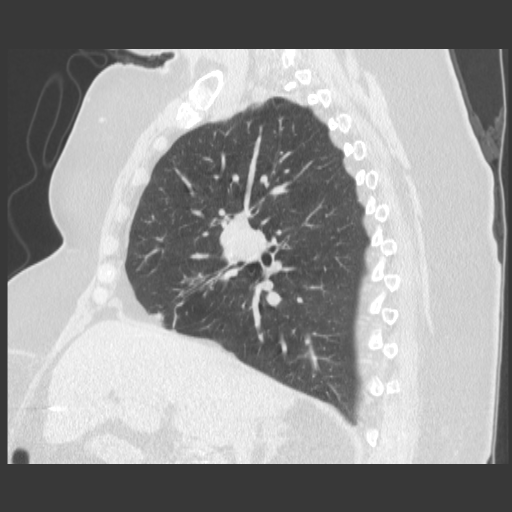
[im 87/145  lung]
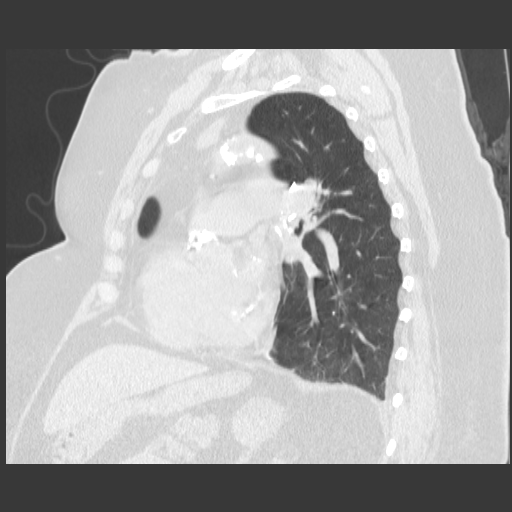

[15 of 36 positions shown; findings below may reference images not displayed]

FINDINGS: Thyroid gland remains inhomogeneous with internal calcification.  Atherosclerotic aortic and coronary arterial calcification noted.  Prominent pulmonary arteries again may suggest pulmonary arterial hypertension.  Left adrenal gland mass stable, measuring 3.8 x 3.1 cm.  No lymphadenopathy, pericardial or pleural effusion.  Right lower lobe 9 x 8 mm nodule on image 24, increased since the prior study, previously measuring approximately 3 mm.  Right lower lobe granuloma again noted image 31.  Prior left upper lobectomy changes again identified.
IMPRESSION: 9 mm right lower lobe pulmonary nodule.  This may be amenable to evaluation at PET CT although it is borderline in size for the expected threshold of possible visualization at PET CT (1 cm).

## 2010-02-26 ENCOUNTER — Other Ambulatory Visit: Payer: Self-pay | Admitting: Internal Medicine

## 2010-02-26 DIAGNOSIS — Z85118 Personal history of other malignant neoplasm of bronchus and lung: Secondary | ICD-10-CM

## 2010-02-28 ENCOUNTER — Encounter: Payer: Self-pay | Admitting: Internal Medicine

## 2010-04-15 ENCOUNTER — Encounter (HOSPITAL_COMMUNITY): Payer: Self-pay

## 2010-04-15 ENCOUNTER — Other Ambulatory Visit: Payer: Self-pay | Admitting: Internal Medicine

## 2010-04-15 ENCOUNTER — Other Ambulatory Visit (HOSPITAL_COMMUNITY): Payer: Self-pay

## 2010-04-15 ENCOUNTER — Encounter (HOSPITAL_BASED_OUTPATIENT_CLINIC_OR_DEPARTMENT_OTHER): Payer: Medicare Other | Admitting: Internal Medicine

## 2010-04-15 ENCOUNTER — Ambulatory Visit (HOSPITAL_COMMUNITY)
Admission: RE | Admit: 2010-04-15 | Discharge: 2010-04-15 | Disposition: A | Discharge status: Home / Self Care | Payer: Medicare Other | Source: Ambulatory Visit | Attending: Internal Medicine | Admitting: Internal Medicine

## 2010-04-15 DIAGNOSIS — Z85118 Personal history of other malignant neoplasm of bronchus and lung: Secondary | ICD-10-CM

## 2010-04-15 DIAGNOSIS — E278 Other specified disorders of adrenal gland: Secondary | ICD-10-CM | POA: Insufficient documentation

## 2010-04-15 DIAGNOSIS — C3430 Malignant neoplasm of lower lobe, unspecified bronchus or lung: Secondary | ICD-10-CM

## 2010-04-15 DIAGNOSIS — K863 Pseudocyst of pancreas: Secondary | ICD-10-CM | POA: Insufficient documentation

## 2010-04-15 DIAGNOSIS — C3490 Malignant neoplasm of unspecified part of unspecified bronchus or lung: Secondary | ICD-10-CM | POA: Insufficient documentation

## 2010-04-15 DIAGNOSIS — E0789 Other specified disorders of thyroid: Secondary | ICD-10-CM | POA: Insufficient documentation

## 2010-04-15 DIAGNOSIS — I77819 Aortic ectasia, unspecified site: Secondary | ICD-10-CM | POA: Insufficient documentation

## 2010-04-15 DIAGNOSIS — K862 Cyst of pancreas: Secondary | ICD-10-CM | POA: Insufficient documentation

## 2010-04-15 HISTORY — DX: Essential (primary) hypertension: I10

## 2010-04-15 HISTORY — DX: Malignant neoplasm of unspecified part of unspecified bronchus or lung: C34.90

## 2010-04-15 LAB — CBC WITH DIFFERENTIAL/PLATELET
Basophils Absolute: 0 10*3/uL (ref 0.0–0.1)
EOS%: 2.1 % (ref 0.0–7.0)
MCH: 30.7 pg (ref 25.1–34.0)
MCV: 92.1 fL (ref 79.5–101.0)
MONO%: 11.4 % (ref 0.0–14.0)
RBC: 3.61 10*6/uL — ABNORMAL LOW (ref 3.70–5.45)
RDW: 15.3 % — ABNORMAL HIGH (ref 11.2–14.5)

## 2010-04-15 LAB — CMP (CANCER CENTER ONLY)
AST: 17 U/L (ref 11–38)
Albumin: 3.6 g/dL (ref 3.3–5.5)
Alkaline Phosphatase: 57 U/L (ref 26–84)
BUN, Bld: 20 mg/dL (ref 7–22)
Potassium: 4.4 mEq/L (ref 3.3–4.7)
Sodium: 148 mEq/L — ABNORMAL HIGH (ref 128–145)

## 2010-04-15 MED ORDER — IOHEXOL 300 MG/ML  SOLN
80.0000 mL | Freq: Once | INTRAMUSCULAR | Status: AC | PRN
  Administered 2010-04-15: 80 mL via INTRAVENOUS

## 2010-04-21 ENCOUNTER — Ambulatory Visit (INDEPENDENT_AMBULATORY_CARE_PROVIDER_SITE_OTHER): Payer: Medicare Other | Admitting: Thoracic Surgery

## 2010-04-21 DIAGNOSIS — C3490 Malignant neoplasm of unspecified part of unspecified bronchus or lung: Secondary | ICD-10-CM

## 2010-04-21 NOTE — Assessment & Plan Note (Signed)
OFFICE VISIT  [name], [name] DOB:  18-Feb-1932                                        April 21, 2010 CHART #:  [id number]  [name] returns for followup of her lung cancer.  She has had both small cell and non-small-cell CT scan today, it showed no evidence of recurrence of either cancers, so she will see Dr. Shirline Frees, tomorrow. Her other problems were some pain in her foot and her left fifth toe and I suggested to see her medical doctor for this.  I will see her back again in 6 months with a CT scan.  Ines Bloomer, M.D. Electronically Signed  DPB/MEDQ  D:  04/21/2010  T:  04/21/2010  Job:  [id number]

## 2010-04-22 ENCOUNTER — Encounter (HOSPITAL_BASED_OUTPATIENT_CLINIC_OR_DEPARTMENT_OTHER): Payer: Medicare Other | Admitting: Internal Medicine

## 2010-04-22 ENCOUNTER — Other Ambulatory Visit: Payer: Self-pay | Admitting: Internal Medicine

## 2010-04-22 DIAGNOSIS — C3430 Malignant neoplasm of lower lobe, unspecified bronchus or lung: Secondary | ICD-10-CM

## 2010-04-22 DIAGNOSIS — Z85118 Personal history of other malignant neoplasm of bronchus and lung: Secondary | ICD-10-CM

## 2010-05-20 LAB — CROSSMATCH: Antibody Screen: NEGATIVE

## 2010-05-20 LAB — CBC
HCT: 30.3 % — ABNORMAL LOW (ref 36.0–46.0)
Hemoglobin: 10.3 g/dL — ABNORMAL LOW (ref 12.0–15.0)
MCHC: 34 g/dL (ref 30.0–36.0)
MCV: 91.4 fL (ref 78.0–100.0)
RDW: 14.5 % (ref 11.5–15.5)

## 2010-05-24 LAB — BASIC METABOLIC PANEL
BUN: 17 mg/dL (ref 6–23)
CO2: 31 mEq/L (ref 19–32)
CO2: 35 mEq/L — ABNORMAL HIGH (ref 19–32)
Calcium: 8.5 mg/dL (ref 8.4–10.5)
Calcium: 8.7 mg/dL (ref 8.4–10.5)
Calcium: 8.8 mg/dL (ref 8.4–10.5)
Chloride: 99 mEq/L (ref 96–112)
Creatinine, Ser: 0.98 mg/dL (ref 0.4–1.2)
Creatinine, Ser: 1.01 mg/dL (ref 0.4–1.2)
GFR calc Af Amer: >60 mL/min (ref >60)
GFR calc non Af Amer: 50 mL/min — ABNORMAL LOW (ref >60)
GFR calc non Af Amer: 56 mL/min — ABNORMAL LOW (ref >60)
Glucose, Bld: 101 mg/dL — ABNORMAL HIGH (ref 70–99)
Glucose, Bld: 109 mg/dL — ABNORMAL HIGH (ref 70–99)
Glucose, Bld: 124 mg/dL — ABNORMAL HIGH (ref 70–99)
Glucose, Bld: 151 mg/dL — ABNORMAL HIGH (ref 70–99)
Potassium: 4 mEq/L (ref 3.5–5.1)
Sodium: 136 mEq/L (ref 135–145)
Sodium: 141 mEq/L (ref 135–145)

## 2010-05-24 LAB — CROSSMATCH
ABO/RH(D): A POS
ABO/RH(D): A POS
ABO/RH(D): A POS
Antibody Screen: NEGATIVE
Antibody Screen: NEGATIVE
Antibody Screen: NEGATIVE

## 2010-05-24 LAB — URINALYSIS, ROUTINE W REFLEX MICROSCOPIC
Bilirubin Urine: NEGATIVE
Glucose, UA: NEGATIVE mg/dL
Hgb urine dipstick: NEGATIVE
Specific Gravity, Urine: 1.017 (ref 1.005–1.030)

## 2010-05-24 LAB — GLUCOSE, CAPILLARY
Glucose-Capillary: 101 mg/dL — ABNORMAL HIGH (ref 70–99)
Glucose-Capillary: 102 mg/dL — ABNORMAL HIGH (ref 70–99)
Glucose-Capillary: 103 mg/dL — ABNORMAL HIGH (ref 70–99)
Glucose-Capillary: 109 mg/dL — ABNORMAL HIGH (ref 70–99)
Glucose-Capillary: 111 mg/dL — ABNORMAL HIGH (ref 70–99)
Glucose-Capillary: 135 mg/dL — ABNORMAL HIGH (ref 70–99)
Glucose-Capillary: 148 mg/dL — ABNORMAL HIGH (ref 70–99)
Glucose-Capillary: 92 mg/dL (ref 70–99)
Glucose-Capillary: 93 mg/dL (ref 70–99)

## 2010-05-24 LAB — DIFFERENTIAL
Basophils Absolute: 0 10*3/uL (ref 0.0–0.1)
Basophils Absolute: 0 10*3/uL (ref 0.0–0.1)
Basophils Absolute: 0 10*3/uL (ref 0.0–0.1)
Basophils Relative: 1 % (ref 0–1)
Eosinophils Absolute: 0 10*3/uL (ref 0.0–0.7)
Lymphocytes Relative: 0 % — ABNORMAL LOW (ref 12–46)
Lymphocytes Relative: 0 % — ABNORMAL LOW (ref 12–46)
Lymphocytes Relative: 52 % — ABNORMAL HIGH (ref 12–46)
Lymphs Abs: 0 10*3/uL — ABNORMAL LOW (ref 0.7–4.0)
Lymphs Abs: 0 10*3/uL — ABNORMAL LOW (ref 0.7–4.0)
Lymphs Abs: 0.4 10*3/uL — ABNORMAL LOW (ref 0.7–4.0)
Monocytes Absolute: 0 10*3/uL — ABNORMAL LOW (ref 0.1–1.0)
Monocytes Absolute: 0 10*3/uL — ABNORMAL LOW (ref 0.1–1.0)
Monocytes Absolute: 0 10*3/uL — ABNORMAL LOW (ref 0.1–1.0)
Neutro Abs: 0 10*3/uL — ABNORMAL LOW (ref 1.7–7.7)
Neutro Abs: 0.3 10*3/uL — ABNORMAL LOW (ref 1.7–7.7)
Neutrophils Relative %: 0 % — ABNORMAL LOW (ref 43–77)
Neutrophils Relative %: 0 % — ABNORMAL LOW (ref 43–77)

## 2010-05-24 LAB — URINALYSIS, MICROSCOPIC ONLY
Glucose, UA: NEGATIVE mg/dL
Ketones, ur: 15 mg/dL — AB
Protein, ur: 100 mg/dL — AB
pH: 6.5 (ref 5.0–8.0)

## 2010-05-24 LAB — COMPREHENSIVE METABOLIC PANEL
ALT: 18 U/L (ref 0–35)
AST: 17 U/L (ref 0–37)
AST: 18 U/L (ref 0–37)
Albumin: 3.1 g/dL — ABNORMAL LOW (ref 3.5–5.2)
Albumin: 3.4 g/dL — ABNORMAL LOW (ref 3.5–5.2)
Alkaline Phosphatase: 67 U/L (ref 39–117)
BUN: 36 mg/dL — ABNORMAL HIGH (ref 6–23)
CO2: 27 mEq/L (ref 19–32)
Chloride: 94 mEq/L — ABNORMAL LOW (ref 96–112)
Chloride: 95 mEq/L — ABNORMAL LOW (ref 96–112)
Creatinine, Ser: 1.09 mg/dL (ref 0.4–1.2)
Creatinine, Ser: 1.27 mg/dL — ABNORMAL HIGH (ref 0.4–1.2)
GFR calc Af Amer: 50 mL/min — ABNORMAL LOW (ref >60)
GFR calc Af Amer: 60 mL/min — ABNORMAL LOW (ref >60)
GFR calc non Af Amer: 41 mL/min — ABNORMAL LOW (ref >60)
Potassium: 3.8 mEq/L (ref 3.5–5.1)
Potassium: 3.8 mEq/L (ref 3.5–5.1)
Sodium: 131 mEq/L — ABNORMAL LOW (ref 135–145)
Total Bilirubin: 1.6 mg/dL — ABNORMAL HIGH (ref 0.3–1.2)
Total Bilirubin: 1.6 mg/dL — ABNORMAL HIGH (ref 0.3–1.2)

## 2010-05-24 LAB — CBC
HCT: 22.3 % — ABNORMAL LOW (ref 36.0–46.0)
Hemoglobin: 7.8 g/dL — CL (ref 12.0–15.0)
Hemoglobin: 8.7 g/dL — ABNORMAL LOW (ref 12.0–15.0)
MCHC: 34.7 g/dL (ref 30.0–36.0)
MCHC: 34.9 g/dL (ref 30.0–36.0)
MCHC: 35.1 g/dL (ref 30.0–36.0)
MCV: 87.2 fL (ref 78.0–100.0)
Platelets: 20 10*3/uL — CL (ref 150–400)
Platelets: 6 10*3/uL — CL (ref 150–400)
Platelets: <5 10*3/uL — CL (ref 150–400)
RBC: 2.64 MIL/uL — ABNORMAL LOW (ref 3.87–5.11)
RBC: 2.72 MIL/uL — ABNORMAL LOW (ref 3.87–5.11)
RBC: 2.86 MIL/uL — ABNORMAL LOW (ref 3.87–5.11)
RDW: 14.7 % (ref 11.5–15.5)
RDW: 14.9 % (ref 11.5–15.5)
RDW: 15.1 % (ref 11.5–15.5)
RDW: 15.3 % (ref 11.5–15.5)
WBC: 0.5 10*3/uL — CL (ref 4.0–10.5)
WBC: <0.4 10*3/uL — CL (ref 4.0–10.5)

## 2010-05-24 LAB — MAGNESIUM
Magnesium: 1 mg/dL — ABNORMAL LOW (ref 1.5–2.5)
Magnesium: 1.3 mg/dL — ABNORMAL LOW (ref 1.5–2.5)

## 2010-05-24 LAB — URINE MICROSCOPIC-ADD ON

## 2010-05-24 LAB — D-DIMER, QUANTITATIVE: D-Dimer, Quant: 2.07 ug/mL-FEU — ABNORMAL HIGH (ref 0.00–0.48)

## 2010-05-24 LAB — POCT CARDIAC MARKERS: Myoglobin, poc: 73.8 ng/mL (ref 12–200)

## 2010-05-25 LAB — CROSSMATCH
ABO/RH(D): A POS
Antibody Screen: NEGATIVE
Antibody Screen: NEGATIVE

## 2010-05-25 LAB — CBC
HCT: 24.4 % — ABNORMAL LOW (ref 36.0–46.0)
HCT: 24.7 % — ABNORMAL LOW (ref 36.0–46.0)
HCT: 26.2 % — ABNORMAL LOW (ref 36.0–46.0)
HCT: 27 % — ABNORMAL LOW (ref 36.0–46.0)
Hemoglobin: 8.6 g/dL — ABNORMAL LOW (ref 12.0–15.0)
Hemoglobin: 8.7 g/dL — ABNORMAL LOW (ref 12.0–15.0)
Hemoglobin: 9.5 g/dL — ABNORMAL LOW (ref 12.0–15.0)
Hemoglobin: 9.7 g/dL — ABNORMAL LOW (ref 12.0–15.0)
MCHC: 35.2 g/dL (ref 30.0–36.0)
MCHC: 35.3 g/dL (ref 30.0–36.0)
MCHC: 35.5 g/dL (ref 30.0–36.0)
MCV: 86.6 fL (ref 78.0–100.0)
MCV: 87.4 fL (ref 78.0–100.0)
MCV: 87.5 fL (ref 78.0–100.0)
MCV: 87.8 fL (ref 78.0–100.0)
Platelets: 9 10*3/uL — CL (ref 150–400)
Platelets: <5 10*3/uL — CL (ref 150–400)
RBC: 2.82 MIL/uL — ABNORMAL LOW (ref 3.87–5.11)
RBC: 2.82 MIL/uL — ABNORMAL LOW (ref 3.87–5.11)
RBC: 3.09 MIL/uL — ABNORMAL LOW (ref 3.87–5.11)
RDW: 14.9 % (ref 11.5–15.5)
RDW: 14.9 % (ref 11.5–15.5)
RDW: 15 % (ref 11.5–15.5)
WBC: 1.3 10*3/uL — CL (ref 4.0–10.5)
WBC: 1.9 10*3/uL — ABNORMAL LOW (ref 4.0–10.5)
WBC: 2.4 10*3/uL — ABNORMAL LOW (ref 4.0–10.5)

## 2010-05-25 LAB — DIFFERENTIAL
Band Neutrophils: 0 % (ref 0–10)
Basophils Absolute: 0 10*3/uL (ref 0.0–0.1)
Basophils Absolute: 0 10*3/uL (ref 0.0–0.1)
Basophils Relative: 0 % (ref 0–1)
Basophils Relative: 0 % (ref 0–1)
Basophils Relative: 0 % (ref 0–1)
Eosinophils Absolute: 0 10*3/uL (ref 0.0–0.7)
Eosinophils Relative: 1 % (ref 0–5)
Eosinophils Relative: 2 % (ref 0–5)
Lymphocytes Relative: 0 % — ABNORMAL LOW (ref 12–46)
Lymphocytes Relative: 17 % (ref 12–46)
Lymphs Abs: 0.4 10*3/uL — ABNORMAL LOW (ref 0.7–4.0)
Lymphs Abs: 0.5 10*3/uL — ABNORMAL LOW (ref 0.7–4.0)
Monocytes Absolute: 0.1 10*3/uL (ref 0.1–1.0)
Monocytes Absolute: 0.2 10*3/uL (ref 0.1–1.0)
Monocytes Absolute: 0.2 10*3/uL (ref 0.1–1.0)
Monocytes Relative: 0 % — ABNORMAL LOW (ref 3–12)
Monocytes Relative: 10 % (ref 3–12)
Monocytes Relative: 7 % (ref 3–12)
Monocytes Relative: 9 % (ref 3–12)
Neutro Abs: 1.1 10*3/uL — ABNORMAL LOW (ref 1.7–7.7)
Neutro Abs: 1.7 10*3/uL (ref 1.7–7.7)
Neutrophils Relative %: 73 % (ref 43–77)

## 2010-05-25 LAB — GLUCOSE, CAPILLARY
Glucose-Capillary: 103 mg/dL — ABNORMAL HIGH (ref 70–99)
Glucose-Capillary: 115 mg/dL — ABNORMAL HIGH (ref 70–99)

## 2010-05-25 LAB — BASIC METABOLIC PANEL
BUN: 13 mg/dL (ref 6–23)
BUN: 18 mg/dL (ref 6–23)
CO2: 31 mEq/L (ref 19–32)
CO2: 31 mEq/L (ref 19–32)
CO2: 33 mEq/L — ABNORMAL HIGH (ref 19–32)
Calcium: 9 mg/dL (ref 8.4–10.5)
Calcium: 9 mg/dL (ref 8.4–10.5)
Chloride: 101 mEq/L (ref 96–112)
Chloride: 101 mEq/L (ref 96–112)
Chloride: 101 mEq/L (ref 96–112)
Chloride: 98 mEq/L (ref 96–112)
Creatinine, Ser: 0.74 mg/dL (ref 0.4–1.2)
Creatinine, Ser: 0.76 mg/dL (ref 0.4–1.2)
Creatinine, Ser: 0.79 mg/dL (ref 0.4–1.2)
GFR calc Af Amer: >60 mL/min (ref >60)
GFR calc Af Amer: >60 mL/min (ref >60)
GFR calc Af Amer: >60 mL/min (ref >60)
GFR calc non Af Amer: >60 mL/min (ref >60)
GFR calc non Af Amer: >60 mL/min (ref >60)
Glucose, Bld: 116 mg/dL — ABNORMAL HIGH (ref 70–99)
Glucose, Bld: 120 mg/dL — ABNORMAL HIGH (ref 70–99)
Glucose, Bld: 145 mg/dL — ABNORMAL HIGH (ref 70–99)
Potassium: 3.3 mEq/L — ABNORMAL LOW (ref 3.5–5.1)
Potassium: 3.7 mEq/L (ref 3.5–5.1)
Potassium: 3.7 mEq/L (ref 3.5–5.1)
Potassium: 4 mEq/L (ref 3.5–5.1)
Sodium: 135 mEq/L (ref 135–145)
Sodium: 137 mEq/L (ref 135–145)
Sodium: 138 mEq/L (ref 135–145)
Sodium: 138 mEq/L (ref 135–145)

## 2010-05-25 LAB — MAGNESIUM
Magnesium: 1.3 mg/dL — ABNORMAL LOW (ref 1.5–2.5)
Magnesium: 1.5 mg/dL (ref 1.5–2.5)
Magnesium: 2 mg/dL (ref 1.5–2.5)

## 2010-05-25 LAB — CLOSTRIDIUM DIFFICILE EIA: C difficile Toxins A+B, EIA: NEGATIVE

## 2010-06-22 NOTE — H&P (Signed)
[name], SEABORN           ACCOUNT NO.:  [id number]   MEDICAL RECORD NO.:  [id number]          PATIENT TYPE:  INP   LOCATION:                               FACILITY:  [facility]   PHYSICIAN:  Lajuana Matte, MD  DATE OF BIRTH:  1932-12-14   DATE OF ADMISSION:  02/05/2008  DATE OF DISCHARGE:                              HISTORY & PHYSICAL   REASON FOR ADMISSION:  A 77 year old white female with history of small  cell lung cancer admitted with significant shortness of breath.   HISTORY:  [name] is a very pleasant 77 year old white female with  past medical history significant for history of stage I non-small cell  lung cancer diagnosed in 2004 status post left upper lobectomy.  The  patient also has a recent diagnosis of small cell lung cancer limited  stage disease status post 1 cycle of chemotherapy with carboplatin and  etoposide and she is currently undergoing cycle #2.  The patient also  had a the past medical history significant for COPD, rheumatoid  arthritis, hypertension, hyperparathyroidism, history of diabetes  mellitus and hypercholesterolemia.  She received the first dose of her  second cycle of chemotherapy yesterday.  She was doing fine.  She was  also started on the first fraction of radiotherapy yesterday.  The  patient mentioned that she had significant shortness of breath at night  time with some retrosternal chest pain.  She did not call or inform her  husband about this problem.  She came today for the second dose of her  chemotherapy and on evaluation at the regional cancer center her oxygen  saturation was found to be 72%.  The patient was immediately started on  oxygen 2 L per minute nasal cannula with improvement in her oxygen  saturation at over 90%.  She continued to have mild pain on the  retrosternal area.  Otherwise she is feeling fine but she was so weak  and fatigued it was felt that it will be unsafe to discharge this  patient to home after  her chemotherapy and she will be admitted to  [facility] for observation overnight and evaluation of her  sudden worsening of dyspnea.   REVIEW OF SYSTEMS:  She has no fever or chills.  No headache, no blurry  vision or double vision.  She had mild retrosternal chest pain as well  as shortness of breath but no cough or hemoptysis.  She has no nausea,  vomiting, no abdominal pain, diarrhea, constipation, melena or  hematochezia or dysuria or hematuria.   PAST MEDICAL HISTORY:  As mentioned above significant for:  1. History of stage I non-small cell lung cancer status post left      upper lobectomy in 2004.  2. Recent diagnosis of small-cell lung cancer limited stage disease.  3. COPD.  4. Rheumatoid arthritis.  5. Hypertension.  6. Hyperparathyroidism.  7. History of endometriosis.  8. Diabetes mellitus.  9. Hypercholesterolemia.  10.Status post cholecystectomy.  11.Status post appendectomy.  The patient denied having any history of coronary artery disease or  stroke.   FAMILY HISTORY:  Mother had  end-stage renal disease as well as heart  disease and the patient does not know much about her father's medical  history.   SOCIAL HISTORY:  She is married.  She has one daughter.  She used to  work for [employer].  The patient has a history of smoking one pack per  day for around 40 years, quit 6 years ago.  No history of alcohol or  drug abuse.   ALLERGIES:  No known drug allergies.   CURRENT HOME MEDICATIONS:  Include:  1. Compazine.  2. Caduet 10/20.  3. Metformin.  4. Toprol XL.  5. Accuretic.  6. Aspirin 81 mg.   PHYSICAL EXAM:  Blood pressure 111/72.  Pulse 108.  Respiratory rate 70.  Oxygen saturation was 72% on room air and 95% on 2 L nasal cannula.  Temperature 98.0.  GENERAL:  Showed a very pleasant 77 year old white female awake, alert,  mildly distressed.  HEENT:  Normocephalic, atraumatic.  Clear oropharynx.  NECK:  Supple.  No lymphadenopathy.   CHEST:  Decreased air movement with few wheezes.  CARDIOVASCULAR:  Normal S1, S2, tachycardiac.  No murmur or gallops.  ABDOMINAL:  Soft, nontender, nondistended.  No masses.  Exam shows no  edema.   LABORATORY DATA:  CBC and CMET performed yesterday showed white blood  count 12.3, hemoglobin 10.6, hematocrit 30.1, platelets 412.  Sodium  141, potassium 4.4, glucose 129, BUN 22, creatinine 1.28, alkaline  phosphatase 66, AST 15, ALT 14, calcium 9.0, albumin 4.1.   ASSESSMENT/PLAN:  This is a very pleasant 77 year old white female with  recent diagnosis of limited stage small cell lung cancer currently  undergoing systemic chemotherapy with carboplatin and etoposide status  post one cycle and the patient currently receiving day two of cycle #2  and she also started her concurrent radiotherapy yesterday.  The patient  presented today with significant shortness of breath.  Her EKG performed  today was unremarkable.  I will admit the patient to [facility] for further evaluation of her dyspnea.  I will order a CT  angiogram of the chest to rule out any pulmonary emboli.  The patient  will continue on oxygen to keep her oxygen saturation over 92%.  I will  also order cardiac enzymes to rule out any cardiac etiology of her  dyspnea.  I will continue her cardiac medication at this point including  the Caduet,Toprol XL, the Accuretic as well as the baby aspirin and if  there is any concern of cardiac etiology of her dyspnea I will consult  cardiology for further evaluation and management of her condition.  For  gastrointestinal prophylaxis, the patient will be on Protonix 40 mg p.o.  daily and for deep vein thrombosis prophylaxis she will be on Lovenox 40  mg subcutaneously daily basis.  For diabetes  mellitus, the patient will be on insulin sliding scale and I will hold  the metformin for the next 24 hours after her CT scan of the chest with  contrast.  The patient will be monitored  closely and if she has  improvement in her condition, she will be discharged home within the  next 1 or 2 days.      Lajuana Matte, MD  Electronically Signed     MKM/MEDQ  D:  02/05/2008  T:  02/05/2008  Job:  [id number]   cc:   Ines Bloomer, M.D.  [address]  [place]  [place] [address]   Maryln Gottron, M.D.  Fax: [phone]

## 2010-06-22 NOTE — Discharge Summary (Signed)
[name], [name]           ACCOUNT NO.:  [id number]   MEDICAL RECORD NO.:  [id number]          PATIENT TYPE:  INP   LOCATION:  1433                         [facility]ACILITY:  [facility]   PHYSICIAN:  Lajuana Matte, MD  DATE OF BIRTH:  [birth year]   DATE OF ADMISSION:  02/05/2008  DATE OF DISCHARGE:  02/07/2008                               DISCHARGE SUMMARY   DISCHARGE DIAGNOSES:  1. Limited stage small cell lung carcinoma, status post two cycles of      chemotherapy with carboplatin and etoposide, with good response per      CT scan on February 05, 2008.  2. COPD exacerbation, requiring modification of respiratory drugs,      status improved.  3. Chest pain.  One run of V-tach, requiring cardiac evaluation.  A 2-      D echo normal LV function.  The patient is to follow up with Dr.      Antoine Poche next week.  4. Hypertension, controlled with current medications.  5. Hypothyroidism.  6. Anemia, to receive 2 units of packed RBCs prior to discharge.  7. Diabetes mellitus type 2.  8. Rheumatoid arthritis.  9. Hypercholesterolemia.   DISCHARGE LABORATORIES:  Sodium 140, potassium 4.7, BUN 32, creatinine  1.22, glucose 135, total bilirubin 0.5, alkaline phosphatase 46, AST 29,  ALT 38, total protein 5.4 at albumin 2.8, calcium 8.5, troponin 0.07.  White count 6.6, hemoglobin 8.4, hematocrit 24.6, platelets 456.  TSH  0.017.  BMP as of December 30, 515.   RADIOLOGICAL STUDIES:  CT angio of the chest February 05, 2008,  revealing no CT evidence for large central pulmonary embolus.  Segmental  and subsegmental pulmonary arteries to the right upper lobe and both  lower lungs are obscured by respiratory motion.  Interval decrease in  mediastinal right hilar lymphadenopathy.  Slight interval decrease in  the right lower lobe pulmonary nodule.  A 2D echo on February 06, 2008,  showing left ventricular systolic function normal.  Left ventricular  ejection fraction 55-60%.  No LV regional wall  motion abnormalities.  Left ventricular wall thickness mildly increased.  Moderate mitral  annular calcification.  Mild MR.  Left atrium mildly dilated.   CONSULTATIONS:  1. Cardiology [facility], February 06, 2008.  2. Dr. Sherene Sires, Critical Care Medicine on February 06, 208.   HISTORY OF PRESENT ILLNESS:  [name] is a pleasant 75-year white  female, patient of Dr. [name], with a history of small cell lung  carcinoma, initially diagnosed in the year 2004, status post left upper  lobectomy.  She also has a recent diagnosis of small cell lung cancer,  limited stage disease, status post one cycle of chemotherapy with  carboplatin and etoposide, currently undergoing cycle number two.  The  patient had received her first dose of second cycle of chemotherapy on  February 04, 2008.  She was tolerating it well.  She also had started  the first fraction of radiotherapy on February 04, 2008.  The patient  arrived on December 29 for the second dose of chemotherapy, and on  evaluation, her oxygen saturation was found to be 72%.  She was  immediately started on oxygen 2 liters per minute nasal cannula, with  improvement of the oxygen saturations at over 90%.  She continued to  have a mild pain on the retrosternal area, fatigue, for which she was  felt to be safer, if she were to be admitted to [facility],  Oncology unit, for observation and management of her symptoms.  Upon  admission, a CT angio of the chest was performed, which ruled out  pulmonary emboli.  Her cardiac enzymes, however, were ordered, in order  to rule out any cardiac etiology.  Upon admission, she was initiated on  Lovenox prophylaxis, as well as GI prophylaxis.  She also was given  insulin sliding scale for blood sugar control.  She was monitored  closely.  Her cardiology evaluation was requested, since during this  admission, she has one run of V-tach, which was a new evident.  Dr.  Antoine Poche has seen the patient,  concluding that she had some typical and  atypical features.  Therefore, he wanted to be aggressive.  An echo was  performed at first, and if either it were normal, then a cath were to be  suggested.  However, these results of the echo turned out to be  essentially negative for which a catheterization was not recommended,  but a myosin study, once the patient was to be discharged, was more  appropriate.  This is to be scheduled on the following week, as an  outpatient at the [facility] office.  As for her respiratory status, Dr.  Sherene Sires had seen the patient and noticed that her shortness of breath was  more reproducible in pattern, without flares.  Therefore, medications  were adjusted, her nebulizers were discontinued, but her inhaler regimen  was changed.  Advair 250/50 one p.o. b.i.d. was prescribed to her.  In  addition, her Prinivil was discontinued, suspecting that was also  increasing her cough episodes.  Moreover, she was given Combivent  inhalers as well, as needed for cough and wheezing.  During her  hospitalization, her radiation therapy continued, receiving two more  treatments on December 30 and December 31.  As of today, There is a  total of four radiation treatments out of a planned course of 28  treatments.  Today, the patient is stable, feeling better, and wants to  be discharged home.  Her vital signs are stable.  She is afebrile and  her saturation is 94% on 2 liters.  Of note, although overall her  situation is improved, it is felt that for her anemia, it would be wise  to order 2 units packed RBC transfusion, prior to her discharge, and she  will continue to be followed up at the [facility] as  scheduled.  The patient is also to return to Dr. Antoine Poche next week, for  further evaluation of her cardiac status.  She knows to call if she has  any questions or concerns to the office of the [facility],  telephone number [phone].      Marlowe Kays,  P.A.      Lajuana Matte, MD  Electronically Signed    SW/MEDQ  D:  02/07/2008  T:  02/07/2008  Job:  [id number]

## 2010-06-22 NOTE — Letter (Signed)
October 21, 2009   Lajuana Matte, MD  [phone]  [place], [place] [address]   Re:  Lisa, [name]                 DOB:  11/03/1932   Dear [name]:   I saw the patient back today and her CT scan showed evidence of  recurrence of her cancer.  After she completed chemotherapy for small  cell, she is doing well overall.  Her blood pressure was 130/70, pulse  82, respirations 18, and sats were 95%.  I will see her back again when  she has her next CT scan.  It looks like she has got a good initial  result.   Ines Bloomer, M.D.  Electronically Signed   DPB/MEDQ  D:  10/21/2009  T:  10/22/2009  Job:  [id number]

## 2010-06-22 NOTE — Assessment & Plan Note (Signed)
OFFICE VISIT   Lisa, [name]  DOB:  April 26, 1932                                        April 02, 2008  CHART #:  [id number]   The patient came today.  She got her last dose of chemotherapy.  She had  2 episodes of admission to the hospital secondary to complications.  Her  blood pressure was 98/63, pulse 86, respirations 20, and sats were 92%  oxygen.  Overall, she is making satisfactory progress.  We will see her  back again in 2 months.  She will get a CT scan on April 17, 2008, which  we will hopefully follow up on.   Ines Bloomer, M.D.  Electronically Signed   DPB/MEDQ  D:  04/02/2008  T:  04/03/2008  Job:  [id number]

## 2010-06-22 NOTE — Assessment & Plan Note (Signed)
OFFICE VISIT   ONETHA, GAFFEY  DOB:  1932-08-10                                        January 23, 2008  CHART #:  [id number]   The patient comes today.  She has started radiation and chemotherapy.  She has had a low white count, but this has been watched by Dr. [name]  and Dr. Dayton Scrape.  Overall, she is stable.  She has started radiation.  Her blood pressure is 136/72, pulse 80, respirations 18, and sats are  93%.  I will see her back again in 2 months with a chest x-ray or before  she has any problems.   Ines Bloomer, M.D.  Electronically Signed   DPB/MEDQ  D:  01/23/2008  T:  01/24/2008  Job:  [id number]

## 2010-06-22 NOTE — Op Note (Signed)
[name], [name]           ACCOUNT NO.:  [id number]   MEDICAL RECORD NO.:  [id number]          PATIENT TYPE:  AMB   LOCATION:  SDS                          FACILITY:  [facility]   PHYSICIAN:  Ines Bloomer, M.D. DATE OF BIRTH:  20-Jul-1932   DATE OF PROCEDURE:  12/28/2007  DATE OF DISCHARGE:  12/28/2007                               OPERATIVE REPORT   PREOPERATIVE DIAGNOSIS:  Right lower lobe mass with mediastinal hilar  adenopathy.   POSTOPERATIVE DIAGNOSIS:  Right lower lobe mass with mediastinal hilar  adenopathy.   OPERATION PERFORMED:  Video bronchoscopy and mediastinoscopy.   After general anesthesia, the video bronchoscope was passed through the  endotracheal tube.  The carina was in the midline.  The left mainstem  and left lower lobe orifices were normal.  The left upper lobe bronchial  stump was well healed.  Pictures were taken of this.  Then we turned to  the right mainstem bronchus, which is normal.  The right upper lobe  bronchus was normal.  The right middle lobe was normal and we went down  to the medial basilar segment of the right lower lobe where we could see  the lesion on fluoro.  Nothing was seen endobronchially.  We did  brushings to this area and then finally did transbronchial biopsies in  this area, all under fluoro guidance.  The video bronchoscope was  removed, the anterior neck was prepped and draped in usual sterile  manner.  The patient had previously had a thyroidectomy.  We used the  middle portion of thyroidectomy and dissected down through the  subcutaneous tissue to the trachea inferiorly and then entered the  pretracheal fascia with digital exploration.  The video mediastinoscope  was inserted and biopsies of those were done.  They appeared to be  normal.  I marked the area of biopsy with Ligaclips.  The video  mediastinoscope was removed.  Strap muscles were closed with 2-0 Vicryl,  subcutaneous tissue with 3-0 Vicryl and Dermabond for  the skin.  The  patient was turned to recovery room in stable condition.      Ines Bloomer, M.D.  Electronically Signed     DPB/MEDQ  D:  12/28/2007  T:  12/29/2007  Job:  [id number]   cc:   Ines Bloomer, M.D.

## 2010-06-22 NOTE — Assessment & Plan Note (Signed)
OFFICE VISIT   [name], [name]  DOB:  22-Jul-1932                                        April 24, 2009  CHART #:  [id number]   The patient came today and her CT scan showed no evidence of recurrence  of her regional cancer or small cell cancer.  The adrenal tumor is  unchanged.  She is doing well overall.  She is on 2-3 L of oxygen.  Her  blood pressure is 124/69, pulse 100, respirations 20, saturations were  98%.  We will see her again in September after her next CT scan.   Ines Bloomer, M.D.  Electronically Signed   DPB/MEDQ  D:  04/24/2009  T:  04/25/2009  Job:  [id number]

## 2010-06-22 NOTE — Consult Note (Signed)
[name], [name]           ACCOUNT NO.:  [id number]   MEDICAL RECORD NO.:  [id number]          PATIENT TYPE:  INP   LOCATION:  1433                         FACILITY:  [facility]   PHYSICIAN:  Casimiro Needle B. Sherene Sires, MD, FCCPDATE OF BIRTH:  1932-03-28   DATE OF CONSULTATION:  02/06/2008  DATE OF DISCHARGE:                                 CONSULTATION   REQUESTING PHYSICIAN:  Mohamed K. [name], MD.   REASON FOR CONSULTATION:  Unexplained dyspnea.   HISTORY:  This is a 77 year old white female who states she quit smoking  approximately 6 years ago and has been chronically short of breath with  exertion.  However, she abruptly became short of breath with minimal  activity 2 days prior to admission associated with acute dyspnea.  She  describes a sensation as heaviness in the chest with no lateralizing  pleuritic pain or significant cough.  She also has noticed slightly  increased leg swelling and also some increase in headache.  She is  better, however, overall since she was placed on oxygen and as rule out  for MI and PE by CT scan.   I was asked to see and evaluate her for dyspnea.  Presently, she states  again she is feeling better with no pleuritic pain.  No cough and does  not have a history of significant asthma, allergies, sinus disease or  reflux.   PAST MEDICAL HISTORY:  1. Non-small cell carcinoma, status post left upper lobectomy in 2004      with recent diagnosis of recurrent disease.  2. COPD, not otherwise classified per records.  (she tells me she had      PFTs; these have been requested but are not available at the time      of dictation.  3. History of possible rheumatoid arthritis.  4. Morbid obesity.  5. Hypertension.  6. History of hyperparathyroidism.  7. History of diabetes.  8. History of hyperlipidemia.  9. Status post cholecystectomy and appendectomy.   ALLERGIES:  None known.   MEDICATIONS AT HOME:  Include ACE inhibitors in the form of Accuretic,  Toprol, metformin, Caduet, Compazine and aspirin.  She has been not on  any oxygen and does not remember using any inhalers.   SOCIAL HISTORY:  She quit smoking 6 years ago and has no unusual  exposure history.   FAMILY HISTORY:  Positive for renal failure and heart disease in her  mother.  She does not know much about her father's family history.   REVIEW OF SYSTEMS:  Taken in detail and negative except as outlined  above.   PHYSICAL EXAMINATION:  GENERAL:  This is a somewhat cantankerous white  female who had a difficult time answering questions in a straightforward  manner.  She struggled quite a bit to answer questions in a  straightforward manner.  She was afebrile.  VITAL SIGNS:  Normal.  HEENT:  Unremarkable.  Pharynx clear.  NECK:  Supple without cervical adenopathy or tenderness.  Trachea is  midline, no thyromegaly.  LUNG FIELDS:  Reveal diminished breath sounds but no wheezing.  HEART:  There is regular rate and rhythm  without murmur, gallop or rub  ABDOMEN:  Massively obese but otherwise benign.  EXTREMITIES:  Warm without calf tenderness, cyanosis, clubbing or edema.   Echocardiogram was available for review from today indicating normal LV  function with possible diastolic dysfunction, no evidence of RV  abnormalities.  There was mild mitral valve regurgitation with mild left  atrial dilatation.   LABORATORY DATA:  CBC reveals hematocrit of 30%.  Bicarb level of 22,  creatinine of 1.23, a BNP of 515.  CT scan as noted above showed no  evidence of large vessel disease.   IMPRESSION:  1. Morbid obesity is this patient's primary pulmonary problem with      chronic dyspnea on exertion on this basis.  This leaves her with      very little ventilatory reserve and also chronically poor VQ      mismatching and tendency to hypoxemia without deep breathing.  2. The CT scan that she had was adequate to exclude large vessel      emboli although you could miss small peripheral  emboli; the      syndrome of small pulmonary emboli is of pleuritic chest pain      syndrome, not central chest pain, which would be more typical of      right ventricular ischemic pain and does not appear to be the case      here.  3. She carries a diagnosis of chronic obstructive pulmonary disease      and has smoked.  PFTs have been obtained already and have been      requested for review.  4. Some of her symptoms seem atypical to me and warrant a trial off      ACE inhibitors, although note the absence of cough here rules      against an ACE inhibitor effect, but certainly does not rule it      out.  Specifically, I recommended that she substitute an ARB for      ACE inhibitors for 6 weeks until a baseline is established in terms      of her symptoms and then reintroduce ACE inhibitors if they are      felt to be indicated still.   ADD:  PFT's 12/12/07  with moderate airflow obstruction, no response to  albuterol. Will rec trial of Advair 250/50 bid for her apparent  exacerbation (if that's what this is, which remains in doubt) with f/u  as outpt by Dr Maple Hudson to assess response or lack thereof.      Charlaine Dalton. Sherene Sires, MD, [facility]  Electronically Signed     MBW/MEDQ  D:  02/06/2008  T:  02/06/2008  Job:  [id number]   cc:   Lajuana Matte, MD  Fax: [phone]

## 2010-06-22 NOTE — Assessment & Plan Note (Signed)
OFFICE VISIT   [name], [name]  DOB:  Jan 21, 1933                                        December 23, 2008  CHART #:  [id number]   The patient came in today after a 37-month CT scan.  Her blood pressure  is 121/73, pulse 92, respirations 20, sats were 92%.  Overall, she is  doing well.  CT scan showed no evidence of recurrence in her lung.  She  still has the adrenal mass which they continued to read as a met and a  small cystic mass in her distal pancreas which is also stable.  She will  be scheduled for another CT scan in 3 months, and I will see her again  after she sees Dr. [name].  I appreciate the opportunity of seeing the  patient.   Ines Bloomer, M.D.  Electronically Signed   DPB/MEDQ  D:  12/23/2008  T:  12/24/2008  Job:  [id number]

## 2010-06-22 NOTE — Letter (Signed)
December 19, 2007   Lennis P. Darrold Span, MD  [address] [facility]  [place], [place] [address]   Re:  LEEN, TWOREK                 DOB:  [birth year]   Dear Ottie Glazier,   I saw the patient in the office today.  Her CT scan of her brain was  negative, however her PET scan shows that the right lower lobe lesion is  positive as well as questionable hilar and mediastinal adenopathy.  I  will schedule for bronchoscopy and mediastinoscopy on March 30, 2007.  Her pulmonary function test showed an FVC of 1.77 with an FEV-1 of 1,  which is 50% and diffusion capacity corrected of 100%.  So, I feel she  probably has either a recurrent or a second cancer and is probably a  clinical stage IIIA.  I will let you know what our findings are after  mediastinoscopy and bronchoscopy.   Ines Bloomer, M.D.  Electronically Signed   DPB/MEDQ  D:  12/19/2007  T:  12/19/2007  Job:  [id number]

## 2010-06-22 NOTE — Letter (Signed)
December 11, 2007   Lennis P. Darrold Span, MD  [address] [facility]  [place], [place] [address]   Re:  [name], [name]                 DOB:  04/29/1932   Dear Ottie Glazier:   I saw the patient back today.  Approximately 3 months ago, she was found  to have a right lower lobe lesion that was measuring 8 x 8 mm and her  renal mass was stable.  She know, we originally did a lobectomy on her,  a left upper lobectomy for squamous cell cancer in April 2004.  We  decided to repeat her CT scan in 6 months for followup and she came back  today and the lesion had increased from 9 to 13 mm and there is also a  question of an increased node, so obviously she may have a second  primary and possibly stage IIIA in clinical staging.  I plan to get a  PET scan on her and a brain scan, pulmonary function test and then we  will see her back again to decide if we need to do a biopsy.  I  appreciate the opportunity of seeing the patient.   Ines Bloomer, M.D.  Electronically Signed   DPB/MEDQ  D:  12/11/2007  T:  12/11/2007  Job:  [id number]

## 2010-06-22 NOTE — Assessment & Plan Note (Signed)
OFFICE VISIT   [name], [name]  DOB:  08-18-1932                                        May 28, 2008  CHART #:  [id number]   The patient has completed her radiation chemotherapy for small cell  cancer.  Dr. Dayton Scrape decided not to give her prophylactic cranial  radiation because of herage.  The CT scan in March showed no evidence of  recurrence.  It again mentioned the left adrenal mass which we biopsied  in 2005 and it was benign.  Her blood pressure was 121/66, pulse 89,  respirations 18, and saturations were 92% on oxygen.  She is doing well  overall.  We will see her back again in 3 months to check on her status  after she has had her next scans.   Ines Bloomer, M.D.  Electronically Signed   DPB/MEDQ  D:  05/28/2008  T:  05/29/2008  Job:  [id number]

## 2010-06-22 NOTE — Discharge Summary (Signed)
[name], [name]           ACCOUNT NO.:  [id number]   MEDICAL RECORD NO.:  [id number]          PATIENT TYPE:  INP   LOCATION:  1319                         FACILITY:  [facility]   PHYSICIAN:  Hillery Aldo, M.D.   DATE OF BIRTH:  December 14, 1932   DATE OF ADMISSION:  03/05/2008  DATE OF DISCHARGE:  03/14/2008                               DISCHARGE SUMMARY   PRIMARY CARE PHYSICIAN:  Ralene Ok, M.D.   ONCOLOGIST:  Dr. Si Gaul.   DISCHARGE DIAGNOSES:  1. Vasovagal syncope.  2. Severe pancytopenia status post chemotherapy.  3. Limited stage small-cell lung cancer status post chemotherapy with      carboplatin and etoposide.  4. Type 2 diabetes.  5. Hypertension.  6. Chronic obstructive pulmonary disease.  7. Dyslipidemia.  8. Morbid obesity.  9. Hypokalemia.  10.Hypomagnesemia  11.Esophagitis.  12.Diarrhea.   DISCHARGE MEDICATIONS:  1. Quinapril/HCTZ 20/25.  The patient is instructed to hold until she      follows up with her primary care physician secondary to low blood      pressures.  2. Aspirin 81 mg daily:  The patient instructed to hold until her      platelet count recovers.  3. Caduet 10/20 one tablet daily.  4. Metformin 500 mg b.i.d.  5. Metformin 50 mg daily.  6. Carafate 1 gram slurry q.i.d.  7. Viscous lidocaine 10 mL swish and swallow q.i.d. p.r.n.  8. Ventolin 2.5 mg HFA 1-2 puffs q.4 h p.r.n.  9. Atrovent 0.5 mg h.s. a 2 puffs q.4 h p.r.n.   CONSULTATIONS:  Dr. [name] of Oncology.   BRIEF ADMISSION HISTORY OF PRESENT ILLNESS:  The patient is a 77-year-  old female with a past medical history of lung cancer undergoing active  chemotherapy treatment with Dr. [name] who presented to the hospital  after a syncopal episode.  The patient had been on the commode when she  became lightheaded, called out for help, and subsequently went limp in  her husband's arms after he went to check on her.  She was helped to the  floor where she came in and out  of consciousness for several minutes and  subsequently was brought to the emergency department for evaluation.  Upon initial presentation, the patient was hypotensive, tachycardiac,  and anemic and therefore admitted for further evaluation and treatment.  For full details, please see my dictated H&P.   PROCEDURES AND DIAGNOSTIC STUDIES:  Chest x-ray on March 04, 2008  showed no acute disease.   DISCHARGE LABORATORY VALUES:  Sodium was 137, potassium 3.7, chloride  101, bicarb 31, BUN 13, creatinine 0.76, glucose 145.  White blood cell  count was 2.4, hemoglobin 9.5, hematocrit 27, platelets 9 with an  absolute neutrophil count of 1.7.   HOSPITAL COURSE BY PROBLEM:  1. Vasovagal syncope.  The patient was admitted and hydrated.  Her      antihypertensive medications with the exception of metoprolol were      held.  At this time, the patient's blood pressure remains on the      soft side, and she will be instructed to continue to  hold her      quinapril with HCTZ until she follows up with her primary care      physician for recheck of her blood pressure.  She has been      asymptomatic throughout her hospital stay.  2. Severe pancytopenia status post chemotherapy.  The patient has had      significant pancytopenia requiring multiple blood transfusions as      well as platelet transfusions.  Because of her severe neutropenia,      the patient was put on neutropenic precautions and empirically      treated with Levaquin.  Diflucan was also added for prolonged      neutropenia.  She is maintained on both neutropenic and bleeding      precautions and monitored closely.  Neupogen was started shortly      after admission to help support her white blood cell count.  At      this point, the patient's counts are beginning to recover and she      has been deemed stable for discharge by her oncologist and      therefore will be discharged home today.  We will discontinue      Diflucan and  Levaquin upon discharge.  3. Limited stage small-cell lung cancer.  The patient was monitored      daily by the oncologist.  She has been treated with concurrent      chemotherapy and radiation therapy.  Both have been held throughout      her hospital stay secondary to severe pancytopenia.  She should      follow up with Dr. [name] in 1-2 weeks and resume chemo when      stable.  4. Type 2 diabetes.  The patient's diabetes has been excellently      controlled.  She will continue on a carbohydrate modified diet and      therapy with metformin.  The patient did have a hemoglobin A1c      value checked on March 05, 2008 and it was 6.4 which corresponds      to a mean plasma glucose of 137.  5. Hypertension.  The patient was mildly hypotensive on admission.      All of her antihypertensives were initially held and when her      systolic blood pressure rose into the 130s, metoprolol was      restarted.  Her other antihypertensives have continued to be on      hold.  She should continue to hold the quinapril/HCTZ until she      follows up with her primary care physician.  6. Chronic obstructive pulmonary disease.  The patient's respiratory      status has remained stable.  She can continue on albuterol and      Atrovent inhaler as needed.  She is no longer being treated with      Advair and states this medication has not helped her in the past.  7. Dyslipidemia:  The patient has been maintained on her usual dose of      statin therapy.  8. Morbid obesity.  The patient had very limited p.o. intake secondary      to esophagitis.  She was put on a clear liquid diet initially and      advanced to full liquids as supplements.  At this point, the      patient has been tolerance of a soft diet and she should continue  on a carbohydrate modified diet with calorie restriction at      discharge.  9. Hypokalemia/hypomagnesemia:  The patient was repleted as needed.  10.Esophagitis:  The  patient's esophagitis likely stems from her prior      treatment with chemotherapy and radiation.  She was put on a      combination of Magic Mouth Wash and viscous lidocaine along with      Carafate.  At this point, her esophagitis has improved to the point      where she is now able to tolerate a soft diet.  We will discharge      her on viscous lidocaine for symptomatic treatment.  11.Diarrhea.  The patient did develop some diarrhea during her      hospital stay.  Clostridium difficile toxin was checked x2 and both      samples were negative.  She can use Lomotil as needed.   DISPOSITION:  The patient is medically stable and will be discharged  home today.  She should follow up with Dr. [name] next week.  She  should follow up with Dr. Ludwig Clarks in 2-3 weeks.  She should have her  blood pressure rechecked in 1-2 weeks with consideration of restarting  her quinapril/HCTZ at that time.   Time spent coordinating care for discharge and discharge instructions  equals 40 minutes.      Hillery Aldo, M.D.  Electronically Signed     CR/MEDQ  D:  03/14/2008  T:  03/14/2008  Job:  [id number]   cc:   Ralene Ok, M.D.  Fax: [phone]   Lajuana Matte, MD  Fax: [phone]

## 2010-06-22 NOTE — Assessment & Plan Note (Signed)
OFFICE VISIT   MADDELINE, ROORDA  DOB:  10-11-1932                                        Jun 27, 2007  CHART #:  [id number]   HISTORY OF PRESENT ILLNESS:  Patient is a 77 year old Caucasian female  who is status post left thoracotomy and left upper lobectomy with lymph  node dissection for squamous cell cancer of the left upper lobe by Dr.  Edwyna Shell in April, 2004.  Patient was last seen in the office on 03/29/07.  At that time, she had a CAT scan that showed a stable left adrenal gland  mass but also a question of a new 9 mm nodule in the right lower lobe.  Patient presented to the office today with a CAT scan of the chest that  again showed a stable left adrenal gland mass measuring 3.1 x 3.9 cm and  a stable right lower lobe nodule measuring 8 x 8 mm.   Patient denies any complaints at this time (denies any fevers or chills,  night sweats, or weight loss).   PHYSICAL EXAMINATION:  GENERAL:  This is a pleasant 77 year old  Caucasian female who is in no acute distress.  She is alert, oriented  and cooperative.  VITAL SIGNS:  BP 102/68, pulse rate 67, respiratory rate 20, O2 sat 98%  on room air.  CARDIOVASCULAR:  Regular rate and rhythm.  PULMONARY:  Clear to auscultation bilaterally.  No rales, wheezes, or  rhonchi.   IMPRESSION/PLAN:  1. Status post left thoracotomy and left upper lobectomy with lymph      node dissection for squamous cell cancer of the left upper lobe in      April, 2004.  2. Stable left adrenal gland mass.  3. Stable nodule, right lower lobe.   Patient will be seen in six months with another follow-up CT.  She is to  call for any questions, problems, or concerns in the interim.   Doree Fudge, PA   DZ/MEDQ  D:  06/27/2007  T:  06/27/2007  Job:  [id number]

## 2010-06-22 NOTE — H&P (Signed)
NAMEJULEAH, [name]           ACCOUNT NO.:  [id number]   MEDICAL RECORD NO.:  [id number]          PATIENT TYPE:  INP   LOCATION:  0109                         FACILITY:  [facility]   PHYSICIAN:  Hillery Aldo, M.D.   DATE OF BIRTH:  July 14, 1932   DATE OF ADMISSION:  03/05/2008  DATE OF DISCHARGE:                              HISTORY & PHYSICAL   PRIMARY CARE PHYSICIAN:  Ralene Ok, M.D.   ONCOLOGIST:  Dr. [name].   CHIEF COMPLAINT:  Syncopal event.   HISTORY OF PRESENT ILLNESS:  The patient is a 77 year old female who was  under the care of Dr. [name] for treatment of lung cancer.  She was  brought to the emergency department via EMS secondary to a syncopal  episode where she had actually lost consciousness according to her  husband.  The patient apparently cried out for help while sitting on the  commode and when her husband went to help her, she went limp and he  helped her to the floor where she came in and out of consciousness for  several minutes.  According to the patient's husband, she has had some  loose stools and some nausea, but no vomiting.  The patient reported  having esophageal pain prior to this episode, but had no other prodromal  symptoms.  She denies any current pain.  She did not notice any dyspnea  or other symptoms.  Upon initial evaluation in the emergency department,  she is noted to be hypotensive, tachycardiac, and has a significant  normocytic anemia with a hemoglobin of 8.  She is being admitted for  further evaluation and treatment.   PAST MEDICAL HISTORY:  1. Small cell lung cancer originally diagnosed in 2004 status post      left upper lobectomy.  Most recently, the patient has had      recurrence and has been treated with radiation and chemotherapy      including carboplatin and Etoposide  2. Type 2 diabetes.  3  Hypertension.  4  Chronic obstructive pulmonary disease.  5  History of nephrolithiasis.  6  History of spondylosis  1. History  of ventricular tachycardia with preserved LV function on      two-dimensional echocardiogram.  2. 3.4 cm AAA.  3. History of endometriosis status post laparoscopic surgery.  4. Chronic anemia.  5. History of rheumatoid arthritis.  6. Hypercholesterolemia.  7. Morbid obesity.  8. History of cerebral aneurysm.  9. History of detached retina  10.Hyperparathyroidism status post parathyroidectomy.  11.Status post cholecystectomy.  12.Status post appendectomy.   FAMILY HISTORY:  The patient's mother died at age 42 of coronary artery  disease, cerebrovascular disease, and renal failure.  The patient's  father is deceased, but she is uncertain as to cause and admits that she  was estranged from him prior to his death.  One of her brothers died of  heart disease at age 87.  He also had renal failure.  She has one  healthy daughter.   SOCIAL HISTORY:  The patient is married.  She quit smoking approximately  6 years ago, but has a 80 pack-year history  of tobacco abuse prior to  this.  She did office work for [employer].  She denies any  history of alcohol or drug use.   ALLERGIES:  None.   CURRENT MEDICATIONS:  1. Hydrochlorothiazide 12.5 mg daily.  2. Aspirin 81 mg daily.  3. Caduet 10/20 1 tablet daily.  4. Metformin 500 mg b.i.d.  5. Metoprolol 50 mg daily.  6. Carafate 1 gram q.i.d.  7. Levaquin 500 mg daily.  8. Advair 250/50 1 inhalation b.i.d.   NOTE:  The patient recently had been taken off quinapril 20 mg daily (an  ACE inhibitor) at the direction of a pulmonologist who saw her in  consultation on prior hospital admission in the event that the ACE  inhibitor was contributing to complaints of dyspnea and possible cough,  although it is unclear whether she is currently taking the quinapril,  her medication list has been reconciled with her pharmacy who reports  that she has prescriptions both for a combination product containing  quinapril and hydrochlorothiazide and  hydrochlorothiazide alone.  The  patient is unclear of her current medications but a review of the  records does reveal that she was taken off the ACE inhibitor.   REVIEW OF SYSTEMS:  Constitutional:  The patient denies any weight  changes.  She does report intermittent fever and chills.  HEENT:  The  patient denies mouth sores, thrush, earaches,  pharyngitis, or  rhinorrhea.  GI:  The patient has had some nausea, but no vomiting.  No  diarrhea except for one episode today.  No melena or hematochezia.  Skin:  Denies rashes or open sores.  Endocrine:  The patient has a  history of diabetes and hyperparathyroidism as noted above.  She denies  any heat or cold intolerance.  No current polydipsia or polyuria.  Genitourinary:  The patient denies dysuria or hematuria.  Heme/lymph:  The patient has chronic anemia.  Eyes:  No vision changes, no diplopia.  Cardiovascular:  No chest pain, no lower extremity edema currently.  Musculoskeletal:  The patient has a history of rheumatoid arthritis.  She has taken methotrexate for this in the past, but is not currently  under any active treatment.  She endorses hand and foot joint related  pains at times.  Neurologic:  The patient denies seizures.  She has  occasional headaches.   ALLERGIES/IMMUNOLOGY:  The patient has no environmental allergies.  Respiratory:  The patient is dyspneic at baseline.  She uses home oxygen  chronically.  She denies cough.  Psychiatric:  The patient denies  depression or anxiety.   PHYSICAL EXAM:  Temperature 98.2, pulse 111, respirations 30, blood  pressure 106/63, O2 saturation 100% on 2 liters.  GENERAL:  Obese female who is in no acute distress.  HEENT:  Normocephalic, atraumatic.  Eyes:  Sclerae are nonicteric,  noninjected.  Pupils are equally round, reactive to light and  accommodation.  Extraocular movements are intact.  Oropharynx is without  evidence of mucositis or thrush.  Mucous membranes are moist.  Tongue  is  midline.  Palate rises symmetrically.  Tympanic membranes not examined.  NECK:  Supple, no thyromegaly, no lymphadenopathy, no jugular venous  distention.  CHEST:  Lungs clear to auscultation bilaterally with fair air movement.  No wheezes or rhonchi.  HEART:  Regular rate, rhythm.  No murmurs, rubs, or gallops.  ABDOMEN:  Soft, nontender, nondistended with normoactive bowel sounds.  GENITOURINARY:  Normal female external genitalia.  EXTREMITIES:  Trace edema bilaterally.  SKIN:  Warm and dry.  No rashes.  NEUROLOGIC:  The patient is alert and oriented x3.  Cranial nerves II-  XII grossly intact.  Nonfocal.  PSYCHIATRIC:  The patient's mood and affect are appropriate.   DATA REVIEW:  A 12-lead EKG shows sinus tachycardia at 102 beats per  minute.   Chest x-ray from March 04, 2008 shows no acute disease.   LABORATORY DATA:  White blood cell count is 0.4, hemoglobin is 8,  hematocrit 23, platelets 20.  Sodium is 131, potassium 3.8, chloride 94,  bicarb 28, BUN 32, creatinine 1.09, glucose 170.  LFTs reveal mild  elevation of total bilirubin and mild depression of total protein and  albumin.  Urinalysis is negative for nitrites and leukocyte esterase.  Cardiac markers are negative times one.   ASSESSMENT/PLAN:  1. Vasovagal syncope preceded by a bowel movement in the setting of      chemotherapy induced anemia and treatment with beta blocker      therapy:  The patient's vasovagal episode is clearly explained by      her having a bowel movement in the setting of chronic treatment      with beta blocker therapy.  She has no neurologic findings on exam      and no further diagnostic evaluation is currently indicated at this      time.  We will hydrate her, hold her antihypertensive medications      and monitor her blood pressure, check orthostatic vital signs, and      give her a blood transfusion to treat her anemia.  2. Severe chemotherapy induced pancytopenia:  The patient  will be      given Neupogen until her absolute neutrophil count is greater than      1.5 for 48 hours.  She has received Neulasta in the outpatient      setting recently.  This time, she has been started on Levaquin by      the ER physician who evaluated her yesterday when she presented      with complaints of dizziness.  Although she does not have any signs      of infection, she is extremely high at high risk given her profound      neutropenia and therefore we will continue her on the Levaquin.  We      will monitor her counts closely and initiate neutropenic and      thrombocytopenic precautions.  3. Lung cancer:  The patient is under the care of Dr. [name] who has      been notified of the patient's admission.  4. Thrombocytopenia:  The patient does not have any signs of active      bleeding.  We will monitor her closely and initiate      thrombocytopenic precautions.  5. Type 2 diabetes:  We will check the patient's hemoglobin and A1c      value and continue her metformin at this time.  We will also cover      her with sliding scale insulin.  6. History of hypertension/mild hypotension:  Again, we will hold the      patient's antihypertensives and gently hydrate.  7. Chronic obstructive pulmonary disease:  We will continue the      patient's Advair.  8. Rheumatoid arthritis:  We will provide the patient with pain      medications as needed.  9. Dyslipidemia:  We will continue the patient's Lipitor.  10.Obesity:  We will place  the patient on a carbohydrate modified      diet.  11.Prophylaxis:  The patient's current platelet count of 20 precludes      using subcutaneous heparin or Lovenox for DVT prophylaxis.  We will      use the PAS hoses for now and initiate proton pump inhibitor      therapy for GI stress ulcer prophylaxis.   Time spent gathering data, analyzing data, assessing the patient and  formulating a plan of care approximately equal to 1 hour.      Hillery Aldo, M.D.  Electronically Signed     CR/MEDQ  D:  03/05/2008  T:  03/05/2008  Job:  [id number]   cc:   Lajuana Matte, MD  Fax: [phone]   Alan Mulder, M.D.  Fax: [phone]

## 2010-06-22 NOTE — Letter (Signed)
September 21, 2006   Lennis P. Darrold Span, M.D.  [address] [facility]  [place]  [place] [address]   Re:  RONIA, HAZELETT                 DOB:  18-Jan-1933   Dear Dr. Darrold Span:   I saw Ms. Huaracha back now four years since her surgery.  We repeated  her CT scan and there is no evidence of recurrence of her cancer.  I  think she is doing well overall.  I plan to see her back again in six  months with another CT scan of the left adrenal mass that we had  previously biopsied was unchanged.  Her blood pressure was 116/634,  pulse 75, respirations 20 and saturations were 100%.   Ines Bloomer, M.D.  Electronically Signed   DPB/MEDQ  D:  09/21/2006  T:  09/22/2006  Job:  [id number]

## 2010-06-22 NOTE — Letter (Signed)
September 17, 2008   Lajuana Matte, MD  [phone]  [place], [place] [address]   Re:  TYONA, NILSEN                 DOB:  1932/11/02   Dear [name]:   I saw the patient back.  It does look like she has got an excellent  response to her chemotherapy.  Her CT scan shows that everything is  stable.  She still has a large left adrenal but that has been there for  a long time.  Her blood pressure was 100/68, pulse 83, respirations 18,  and sats were 95%.  I will see her back again when she has her next CT  scan scheduled.   Sincerely,   Ines Bloomer, M.D.  Electronically Signed   DPB/MEDQ  D:  09/17/2008  T:  09/17/2008  Job:  [id number]

## 2010-06-22 NOTE — Letter (Signed)
January 01, 2008   Lajuana Matte, MD  [address] [place], [place] [address]   Re:  REBIE, PEALE                 DOB:  1932-09-08   Dear Shirline [name],   I saw the patient back today.  She originally had lung cancer in 2004  when she had a left upper lobectomy for squamous cell cancer.  She was  seen at that time by Dr. Darrold Span and no further treatment was needed.  She had an enlarged adrenal gland, which we biopsied in 2005, which was  negative.  We have been following her since then and found to have a  right lower lobe lesion that we saw in April.  It was very small, but  this time repeated here in October and it increased in size.  A PET scan  showed that it is not only positive, but questionable mediastinal nodes.  Bronchoscopy and mediastinoscopy were done and the mediastinoscopy shows  small cell lung cancer.  So, this is probably a small cell lung cancer,  which explains why this small lesion increased under observation.  For  this, I am referring her to you for possible treatment.  She wanted to  get a consult from you specifically.  Her mediastinoscopy incision was  swollen, but was healing satisfactorily.  I will plan to see her back in  3 weeks with another chest x-ray.  Her blood pressure is 144/78, pulse  100, respirations 18, and sats were 92%.   Ines Bloomer, M.D.  Electronically Signed   DPB/MEDQ  D:  01/01/2008  T:  01/01/2008  Job:  [id number]

## 2010-06-22 NOTE — Consult Note (Signed)
[name], [name]           ACCOUNT NO.:  [id number]   MEDICAL RECORD NO.:  [id number]          PATIENT TYPE:  INP   LOCATION:  1433                         [facility]AC[facility]  [facility]   PHYSICIAN:  Rollene Rotunda, MD, FACCDATE OF BIRTH:  [birth year]   DATE OF CONSULTATION:  02/06/2008  DATE OF DISCHARGE:                                 CONSULTATION   PRIMARY CARE PHYSICIAN:  Wilson Singer, M.D.   REASON FOR PRESENTATION:  Evaluate patient with chest pain and  questionable ventricular tachycardia.   HISTORY OF PRESENT ILLNESS:  The patient is a pleasant 77 year old white  female originally from [place].  She has a history of lung cancer that  was resected in 2004 with a left upper lobectomy.  This was non-small-  cell lung cancer.  She was followed and recently found to have small  cell lung cancer in the right lung.  This diagnosis was made a few weeks  ago.  She has had one round of chemotherapy and two radiation  treatments.  She has some baseline dyspnea since her lobectomy.  There  is a questionable diagnosis of COPD, though she says she has never been  labeled as such.  She was not on home O2.  However, she has been very  limited in activity for years.  She does what she wants but needs a  walker.  She gets dyspneic walking a short distance on level ground.  She does not describe resting shortness of breath, nor has had any PND  or orthopnea.   The patient was in this state of health until Monday when she had acute  shortness of breath.  This happened while she was reclining in bed.  She  then developed 8/10 gripping chest discomfort.  This was in the mid  chest.  There was some radiation to the shoulder.  She got up and walked  through her house to the sun room.  She said that discomfort eased over  about 15 minutes.  There was no associated nausea, vomiting or  diaphoresis.  She was not noticing any palpitation, presyncope or  syncope.  She remained short of breath  through that evening and said she  had a restless night.  She went to get chemotherapy at the cancer center  on Tuesday morning.  Because of these complaints and the fact that she  looked gray, she was admitted to the hospital.  She had no acute  findings on her EKG but only sinus tachycardia.  She did have a CT of  her chest demonstrating no evidence of a pulmonary embolism.  She since  has been on the O2 and has been breathing much better.  I am not clear  whether she is at baseline.  She has had no recurrent chest discomfort,  neck or arm discomfort.  She has had no cough, fevers or chills.  She  was seen by pulmonary.  She is being treated with Advair.  She was  switched off of ACE inhibitors to ARBs.   PAST MEDICAL HISTORY:  1. Lung cancer small cell as described.  2. Questionable COPD.  3. Apparent rheumatoid arthritis.  4. Hypertension.  5. Hyperparathyroidism.  6. Diabetes mellitus x4 years.  7. Hyperlipidemia times years.   PAST SURGICAL HISTORY:  1. Cholecystectomy.  2. Appendectomy.  3. Left VATS.  4. Left lobectomy.   ALLERGIES:  None.   MEDICATIONS:  1. Protonix 40 mg daily.  2. Low-molecular weight heparin.  3. Metoprolol 50 mg daily.  4. Aspirin 81 mg daily.  5. Metformin 500 mg b.i.d.  6. Hydrochlorothiazide 12.5 mg daily.  7. Zocor 40 mg daily.  8. Norvasc 10 mg daily.  9. Sliding scale insulin.  10.Avapro 150 mg daily.  11.Combivent.   SOCIAL HISTORY:  The patient has been married to her husband for about  40 years.  She is retired from [employer].  She lives in [place].  She has  one daughter.  She smoked for about 40 pack years, quitting at the time  of her first lung cancer diagnosis.   FAMILY HISTORY:  Noncontributory for early coronary disease, though she  does not know her father's history.  Her mother had later onset coronary  disease.   REVIEW OF SYSTEMS:  As stated in the HPI, positive for mild dizziness.  Negative for all other systems.    PHYSICAL EXAMINATION:  GENERAL:  The patient is not acutely ill but  mildly chronically ill appearing.  VITAL SIGNS:  Blood pressure 133/75, respiratory rate 18, heart rate 86,  afebrile, 93% saturation on 2 liters.  HEENT:  Eyelids unremarkable.  Pupils equal, round and reactive to  light.  Fundi not visualized.  Oral mucosa unremarkable.  NECK:  Difficult to visualize secondary to her size, but no obvious  jugular distention.  Carotid upstroke brisk and symmetric.  No bruits,  no thyromegaly.  LYMPHATICS:  No cervical, axillary or inguinal adenopathy.  LUNGS:  Clear to auscultation bilaterally without wheezing or crackles.  BACK:  No costovertebral tenderness.  CHEST:  Unremarkable.  HEART:  PMI not displaced or sustained.  S1-S2 within normal limits.  No  S3, S4, no clicks, rubs or murmurs.  ABDOMEN:  Morbidly obese, positive bowel sounds, normal in frequency and  pitch.  No bruits, rebound or guarding.  No midline pulsatile mass.  No  hepatomegaly, no splenomegaly.  SKIN:  No rashes, no nodules.  EXTREMITIES:  2+ pulses throughout, no edema, cyanosis or clubbing.  NEUROLOGIC:  Oriented to person, place and time.  Cranial nerves II-XII  grossly intact, motor grossly intact.   ELECTROCARDIOGRAM:  EKG revealed sinus tachycardia, rate 104, axis  within normal limits, intervals within normal limits, no acute ST-T wave  changes.   LABORATORY DATA:  WBC 29, hemoglobin 10.0, platelets 598, WBC 138,  potassium 5.1, BUN 27, creatinine 1.3.   ASSESSMENT/PLAN:  1. Chest discomfort.  The patient's chest comfort is worrisome for      possible unstable angina.  I would have strongly suspected a      pulmonary embolism, as well.  I think it would be reasonable to      review the results of the CT with the radiologist to see whether      the quality was good enough to reasonably exclude small to medium-      sized pulmonary emboli.  If not, I would have a low threshold for      empiric  anticoagulation or lower extremity Doppler.  This also very      well could have been unstable angina though she has no objective  evidence of ischemia.  At this point, I do think it is very      reasonable to rule out myocardial infarction and check an      echocardiogram.  If either of these is abnormal, I would suggest      cardiac catheterization.  If both are normal and there it is felt      that we have reasonably excluded pulmonary embolism, then I would      proceed with stress perfusion imaging.  Depending on how she feels,      this could be done as an outpatient.  2. Chronic obstructive pulmonary disease.  This is a questionable      diagnosis.  It is being followed by Dr. Sherene Sires.  3. Lung cancer.  She remains on chemotherapy and radiation.  4. Obesity.  This is a very significant problem for the patient, but      doubt that we will be able to get a handle on it, given her very      limited functional status.  5. Anemia per Dr. [name].  6. Follow up.  Will follow her through her hospitalization and make      further determinations.      Rollene Rotunda, MD, [facility]  Electronically Signed     JH/MEDQ  D:  02/06/2008  T:  02/06/2008  Job:  [id number]

## 2010-06-25 NOTE — Letter (Signed)
March 30, 2007   Lennis P. Darrold Span, M.D.  [address] [facility]  [place], [place] [address]   Re:  [name], [name]                 DOB:  05/29/1932   Dear Ottie Glazier:   I saw [name] back for followup today and repeated her CT scan.  It has been five years since we had resected her lesion.  Her adrenal  gland is stable, but there is a question of a new 9 mm nodule in the  right lower lobe.  Our previous surgery was in the left upper lobe,  which was 2 cm in size.  I am not sure this is not an area of just  inflammation, but whatever the case is, I plan to repeat her CT scan in  three months and see if there is any change, and that time make a  decision whether we need to order her a PET.   Her blood pressure was 117/65, pulse 71, respirations 20.  Saturations  were 86%.   Ines Bloomer, M.D.  Electronically Signed   DPB/MEDQ  D:  03/30/2007  T:  03/31/2007  Job:  [id number]

## 2010-06-25 NOTE — Consult Note (Signed)
NAME:  [name], [name]                     ACCOUNT NO.:  [id number]   MEDICAL RECORD NO.:  [id number]                   PATIENT TYPE:  INP   LOCATION:  3308                                 FACILITY:  [facility]   PHYSICIAN:  Lennis P. Darrold Span, M.D.             DATE OF BIRTH:  04/22/1932   DATE OF CONSULTATION:  06/07/2002  DATE OF DISCHARGE:                                   CONSULTATION   HISTORY:  The patient is a very pleasant 77 year old white female seen in  consultation at the request of Dr. Edwyna Shell while hospitalized (postop day #2)  from left upper lobe resection and lymph node evaluation.  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  A total of 9 lymph nodes negative for  metastatic involvement.  No identified vascular invasion.  Emphysematous  changes and focal fibrosis in other lung tissue.   The patient has been long-term smoker of two packs per day for more than 40  years, discontinued on the day prior to this hospital admission.  She had  developed bronchitis in February 2004 with a chest x-ray done then showing  nodule on the left upper lobe.  A followup CT scan showed a 1.8 cm nodule  wiht no associated adenopathy and a 3 x 3.8 cm left adrenal mass.  It was  not possible to do percutaneous needle biopsy of the adrenal mass due to  location and her body habitus.  She was evaluated by Dr. Edwyna Shell and  underwent surgery as above on April 28.  Her postoperative course seems to  have been generally unremarkable.   PAST MEDICAL HISTORY:  1. Rheumatoid arthritis diagnosed about 2 to 3 years ago and treated by Dr.     Pollyann Savoy with methotrexate.  The patient's symptoms have been     most pronounced in her hands and feet, these better since she has been on     the methotrexate.  2. She has been followed by Dr.  Trey Sailors for question of a cerebral     aneurysm.  This apparently has been indeterminate on previous scans, and     the patient has not wanted more invasive evaluation.  She tells me she is     due for another MRI scan by Dr. Channing Mutters.  3. History of hypertension.  4. History of hyperparathyroidism.  5. A 3.4 cm abdominal aortic aneurysm with atherosclerotic vascular disease.  6. History of nephrolithiasis.   PAST SURGICAL HISTORY:  1. Parathyroidectomy January 2002.  2. Surgery for detached retina in 1993.  3. Cholecystectomy.  4. Surgery for endometriosis in 1995.   REVIEW OF SYSTEMS:  Positive for dyspnea on exertion.  No productive cough  or hemoptysis prior to admission.  No chest pain or palpitations.  She has  had  an intentional weight loss of just less than 20 pounds prior to  admission.   MEDICATIONS PRIOR TO ADMISSION:  1. Methotrexate 10 mg each Monday.  2. Folic acid 1 mg daily.  3. Mobic 7.5 mg daily.  4. Caltrate 600 mg daily.  5. Norvasc 10 mg daily.  6. She previously had been on Accuretic 20-25 daily and possible also     Norvasc recently.   ALLERGIES:  No known drug allergies.   FAMILY HISTORY:  Mother died with renal failure and a CVA at age 85.  No  other cancer known in close relatives.   SOCIAL HISTORY:  She is originally from [place], has been in [place],  [place], prior to moving to [place] in 1981.  She smoked about two  packs daily for more than 40 years, no alcohol.  They have a daughter who  lives in [place].  The patient is retired from office work.   PHYSICAL EXAMINATION:  GENERAL:  Pleasant, obese lady who looks stated age,  somewhat uncomfortable in bed but alert and appropriate.  Husband extremely  supportive.  VITAL SIGNS:  Weight prior to admission about 230 pounds and height 5 feet 2-  1/2 inches.  Blood pressure 130/48, heart rate 102 and regular, respirations  18, 93% saturation on 3 liters nasal cannula.  HEENT:  Pupils  equal and reactive.  She is not icteric.  Oral mucosa and  posterior pharynx clear.  NECK:  Right IJ line site unremarkable.  No obvious JVD.  No palpable  peripheral adenopathy.  LUNGS:  Clear.  HEART:  Regular rate and rhythm, tachycardic.  BREASTS:  Exam not done presently.  ABDOMEN:  Obese, soft, nothing palpable.  EXTREMITIES:  Lower extremities without pitting edema, cords, or tenderness.   LABORATORY DATA:  White count 16.1, hemoglobin 13.7, platelets 201.  Sodium  137, potassium 4.7, chloride 101, CO2 30, glucose 163, BUN 11, creatinine  0.6, alkaline phosphatase 56, GOT 34, GPT 36, total protein 5.8, albumin  3.1, calcium 9.1, total bilirubin 0.9.   RECOMMENDATIONS:  I have recommended to patient and her husband that we  consider doing PET scan in a few weeks after she has gotten out of the  hospital and is more comfortable from this recent surgery.  (I understand  that PET scan was not possible prior to admission as we did not have a  definitive lung cancer diagnosis then.)  It may also be helpful and  appropriate to get another limited CT through that left adrenal in the next  one to two months.  I certainly agree with the decision that was made to  proceed with the lung surgery and am not at all clear that this mass on the  adrenal is related to the squamous cell lung cancer.  We do not presently  have an intergroup trials for stage I lung cancers, and I did not discuss  any consideration of adjuvant chemotherapy with them in detail today, though  I will certainly go over this as we follow along with the adrenal mass as  above.   Please call if I can be of assistance prior to discharge, and I will plan to  see her back in the office and can set up the other scans on the adrenal  from there.   Thank you for the consultation.  Lennis P. Darrold Span, M.D.   LPL/MEDQ  D:  06/07/2002  T:  06/07/2002  Job:  [id number]   cc:   Ines Bloomer, M.D.  [address]  [place]  [place] [address]  Fax: [phone]   Wilson Singer, M.D.  [address]  [place]  [place] [address]  Fax: [phone]   Clinton D. Young, M.D.  [address] [place]  [place] [address]  Fax: [phone]   Payton Doughty, M.D.  [address]  [place]  [place] [address]  Fax: [phone]

## 2010-06-25 NOTE — Op Note (Signed)
NAME:  [name], [name]                    ACCOUNT NO.:  [id number]   MEDICAL RECORD NO.:  [id number]                   PATIENT TYPE:  INP   LOCATION:                                       FACILITY:  [facility]   PHYSICIAN:  Ines Bloomer, M.D.              DATE OF BIRTH:  Oct 12, 1932   DATE OF PROCEDURE:  06/05/2002  DATE OF DISCHARGE:  06/11/2002                                 OPERATIVE REPORT   PREOPERATIVE DIAGNOSIS:  Left upper lobe mass.   POSTOPERATIVE DIAGNOSIS:  Squamous cell cancer, left upper lobe.   OPERATION/PROCEDURE:  Left basilar, left thoracotomy, left upper lobectomy  with node dissection.   SURGEON:  Ines Bloomer, M.D.   FIRST ASSISTANT:  Eber Hong, P.A.-C.   ANESTHESIA:  General anesthesia.   DESCRIPTION OF PROCEDURE:  After percutaneous insertion of all monitoring  lines, the patient underwent general anesthesia.  The dual lumen tube was  inserted.  She was markedly obese.  She was turned to the left lateral  thoracotomy position.  She had a lesion in the left upper lobe that was 1.5  cm in size after having bronchitis.  There is a history of smoking in the  past.  Two trocar sites were made in the anterior and posterior aspects of  along the seventh intercostal space.  Deep trocars were inserted.  The 30  degree scope was inserted.  There was no intrapleural mass seen.  The lesion  was not seen in the left upper lobe so a posterolateral thoracotomy incision  was made and carried down to the left __________ , through the subcutaneous  tissue and fascia.  The pretracheal fascia was entered through the  subcutaneous tissues.  The latissimus was divided.  Its radius was reflected  anteriorly.  The fifth intercostal space was entered.  Two __________  were  placed at right angles.  Exploration was carried out and the mass was  palpated in the left upper lobe.  Dissection was started in the superior  portion of the hilum, dissecting out the  pulmonary artery and dissecting out  several nodes including a 5 and 6 node.  The dissection was carried down the  posterior mediastinum and dissecting out several 10 L and 11 L nodes.  The  superior portion of the fissure was divided with the EZ-45 stapler and the  inferior portion was also divided with the EZ-45 stapler.  This exposed  several __________  nodes as well as the lingula and the anterior branch of  the pulmonary artery.  The lingula was looped with the vascular tape,  stapled and divided with the AutoSuture stapler.  The anterior branch was  also looped with vascular tape, taped and divided with the AutoSuture  stapler.  Dissection was carried out the pulmonary artery to the apical  posterior branch, was looped with the vascular tape, stapled and divided  with the AutoSuture stapler.  Finally  this left the superior pulmonary vein.  Another 10 L node was dissected.  The superior pulmonary vein was looped and  dissected out, looped with the vascular tape, stapled and divided with the  AutoSuture stapler.  The finding that was left now was the bronchus and this  was dissected free from the surrounding tissue, removing all lymph nodes, 10  L lymph nodes and then looping it with vascular tape and stapling and  dividing it with the TL-30 stapler.  The left upper lobe was removed.  Bronchial margin was negative.  The frozen section of the lesion revealed a  non-small cell lung cancer, squamous cell cancer.  Two chest tubes were  brought in through the trocar sites and tied in place with 0 silk.   The chest was closed with three pericostals and On-Q catheters were placed  in the usual fashion.  Marcaine block was done in the usual fashion.  The  chest was closed with #1 Vicryl in the muscle layer, 2-0 Vicryl in the  subcutaneous tissue and Ethicon skin clips.  The patient was returned to the  recovery room in stable condition.                                               Ines Bloomer, M.D.    DPB/MEDQ  D:  04/13/2003  T:  04/14/2003  Job:  [id number]   cc:   Joni Fears D. Young, M.D.  [address] [place]  [place] [address]  Fax: [phone]

## 2010-06-25 NOTE — Op Note (Signed)
[facility]  Patient:    [name], [name]                  MRN: [id number] Proc. Date: 02/18/00 Adm. Date:  [id number] Attending:  Gennie Alma CC:         Fritzi Mandes, M.D.   Operative Report  PREOPERATIVE DIAGNOSIS:  Primary hyperparathyroidism.  POSTOPERATIVE DIAGNOSIS:  Parathyroid adenoma of right inferior gland and bilateral multinodular goiter.  OPERATIONS: 1. Bilateral neck exploration for primary hyperparathyroidism. 2. Resection of parathyroid adenoma of the right inferior gland. 3. Excision biopsy of thyroid nodule of right inferior pole.  SURGEON:  Milus Mallick, M.D.  ASSISTANT:  Sandria Bales. Ezzard Standing, M.D.  ANESTHESIA:  General endotracheal.  DESCRIPTION OF PROCEDURE:  Under adequate general endotracheal anesthesia, the patients neck was extended over a shoulder roll and then prepared and draped in usual fashion.  A low collar incision was made and carried down through the platysma muscle.  Bleeders were electrocoagulated.  Superior and inferior skin flaps were developed using Bovie electrocoagulation.  Middle cervical fascia was exposed and opened longitudinally in the midline.  The right side was approached first and then patient had a very obese neck that was quite short and deep and for this reason, strap muscles had to be cut on the right side in order to get any kind of exposure.  Strap muscles were divided with the Bovie electrocoagulation.  The surgical plane in the thyroid was entered.  The thyroid was multinodular and quite deep.  The small veins were divided over small hemoclips and this gave greater mobility to the right lobe, so that it could be rotated anteriorly and medially.  Almost immediately a very large nodule was seen adjacent to the lower pole of the right lobe of the thyroid, but it had the appearance of being thyroid tissue.  It was dissected out approximately 2 cm in diameter.  It was  sent for frozen section study, which confirmed thyroid tissue consistent with an adenoma.  Further exploration of the right inferior pole revealed the presence of a parathyroid adenoma.  It was dissected out over small hemoclips.  It was totally excised.  It was 1.1 cm in length, 1.0 cm in width and 0.3 cm in thickness.  It was sent for frozen section study, which confirmed parathyroid adenoma.  Further exploration on the right side of the neck revealed some tissue adjacent to the inferior pole vessels that had the appearance of possibly having parathyroid tissue enclosed within a mass of fatty tissue.  No other candidates for parathyroid glands were seen on the right side.  The left sternohyoid muscle was then retracted laterally and the sternothyroid muscle was divided with Bovie electrocoagulation.  Multiple veins were divided over quadruple hemoclip technique in order to get mobility to the left lobe of the thyroid.  The left lobe of the thyroid was dissected all the way to the inferior pole at which point, a normal-appearing parathyroid gland was encountered.  Further exploration of the left lobe of the thyroid revealed no other parathyroid tissue.  It must be noted, that this was an extremely difficult dissection because of the patients size and short neck.  Both sides of the thyroid had multinodularity to it as well.  Having confirmed the pathologic diagnosis of parathyroid adenoma, the right inferior lobe and having seen two other candidates for normal parathyroid glands, we elected to close the neck at this point.  Hemostasis was  ascertained.  The strap muscles were repaired with interrupted sutures of 4-0 Vicryl.  The platysma muscle was also repaired in the same manner.  The skin was reapproximated with the generic skin stapler 35-W and a sterile dressing was applied.  Estimated blood loss for the procedure was negligible.  The patient tolerated the procedure well and left the  operating room in satisfactory condition. DD:  02/18/00 TD:  02/18/00 Job: [id number]

## 2010-06-25 NOTE — Consult Note (Signed)
NAME:  [name], [name]                     ACCOUNT NO.:  [id number]   MEDICAL RECORD NO.:  [id number]                   PATIENT TYPE:  INP   LOCATION:  3308                                 FACILITY:  [facility]   PHYSICIAN:  Clinton D. Maple Hudson, M.D.              DATE OF BIRTH:  [birth year]   DATE OF CONSULTATION:  06/06/2002  DATE OF DISCHARGE:                                   CONSULTATION   PROBLEM FOR CONSULTATION:  A 77 year old woman postop left thoracotomy, now  seen at the request of Dr. Edwyna Shell for review of pulmonary medications and  airway management.   HISTORY:  This 77 year old active smoker has had a chronic bronchitis.  CT  scan in February demonstrated a left lung nodule and a left adrenal mass and  incidental atheromatous disease of the aorta.  The patient's body habitus  prevented needle biopsy of either nodule and she was evaluated by Dr. Edwyna Shell  who did a left VATS and left thoracotomy and resection of  a squamous cell  carcinoma yesterday.   She has been extubated, managed now with nebulized bronchodilators and  oxygen at three liters per minute.  She has been out of bed ambulating and  up in chair.   REVIEW OF SYSTEMS:  Significant post thoracotomy pain.  She is using her  morphine PCA pump appropriately and feels the pain is controlled but is  still pretty miserable.  She is somewhat dyspneic but feels this is  controlled.  Nebulized bronchodilators are not helping much as yet.  There  is little sputum.  She has not been very effective using an incentive  spirometer.   MEDICATIONS:  Pertinent medications now include morphine, nebulizer  albuterol/Atrovent q.4h., oxygen at three liters per minute.   PAST HISTORY:  Spondylosis and questioned cerebral aneurysm which she has  refused to have further evaluated.  Hyperparathyroidism, status post  resection of parathyroid adenoma.  Resection of a left abdominal wall cyst,  repair of detached hernia, cholecystectomy.   Rheumatoid arthritis treated  with methotrexate.  Medical management of hypertension.  Previous  endometriosis.  No history of diabetes.   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   FAMILY HISTORY:  Mother died of renal failure and cerebrovascular accident.  Father died of unknown cause.  Brother died from renal failure.   PHYSICAL EXAMINATION:  VITAL SIGNS:  Pulse regular and 109, sinus rhythm by  monitor, regular; respirations about 18 per minute, temperature 97.5, blood  pressure 129/53.  GENERAL APPEARANCE:  Short, very obese woman with short, thick neck.  She is  alert and oriented and conversational in short sentences, lying slumped in  bed and not apparently strong enough to pull herself up easily.  She is  splinting her left chest with a foam pad.  SKIN:  No mottling or cyanosis.  ADENOPATHY:  None found at the  neck, shoulders, or axillae.  There is no  stridor.  HEENT:  Speech clear, oral mucosa clear.  CHEST:  Bilateral expiratory wheeze.  A synchronous chest and abdominal  respiratory motion, shallow breath sounds.  HEART:  Regular rhythm, normal S1 and S2, no murmur or gallop audible.  ABDOMEN:  Markedly obese, faint bowel sounds heard.  EXTREMITIES:  PAS hose, no cyanosis.   Chest x-ray:  Routine postop changes with volume loss left lung.   IMPRESSION:  1. Chronic asthmatic bronchitis with active recent preoperative tobacco use,     now status post left thoracotomy with successful extubation.     Postoperative course thus far uncomplicated.  2. Squamous cell carcinoma of the lung, nodule, resected.  3. Adrenal nodule, undefined.  4. Obesity/hypoventilation syndrome.  5. Atelectasis, wheeze and splinting, reflecting postoperative status,     bronchitis, obesity, and chest wall pain.   PLAN:  Agree with present treatment.  Nebulized bronchodilators are  appropriate for now.  We can reevaluate for longer term  control and metered  inhalers as she stabilizes.  Agree with current mobilization and pain  control.  She does not need steroids yet.  Recognize her significant  atherosclerotic disease may present complications subsequently.   Please call for special concerns.  I will look in on her course.  I will be  happy to see her again as appropriate post discharge for help with her long-  term respiratory problems to the extent this is helpful in conjunction with  Dr. Scheryl Darter followup and with Dr. Patty Sermons primary care.                                               Clinton D. Maple Hudson, M.D.    CDY/MEDQ  D:  06/06/2002  T:  06/07/2002  Job:  [id number]   cc:   Ines Bloomer, M.D.  [address]  [place]  [place] [address]  Fax: [phone]   Wilson Singer, M.D.  [address]  [place]  [place] [address]  Fax: [phone]

## 2010-06-25 NOTE — Discharge Summary (Signed)
NAME:  [name], [name]                     ACCOUNT NO.:  [id number]   MEDICAL RECORD NO.:  [id number]                   PATIENT TYPE:  INP   LOCATION:  3308                                 FACILITY:  [facility]   PHYSICIAN:  Ines Bloomer, M.D.              DATE OF BIRTH:  1932/12/28   DATE OF ADMISSION:  06/05/2002  DATE OF DISCHARGE:  06/11/2002                                 DISCHARGE SUMMARY   PRIMARY CARE PHYSICIAN:  Dr. Karilyn Cota.   PULMONOLOGIST:  Dr. Maple Hudson.   NEUROSURGEON:  Dr. Trey Sailors.   ONCOLOGIST:  Dr. Darrold Span.   FINAL DIAGNOSES:  1. Squamous cell cancer, left upper lobe, 2-cm lesion, stage T1-N0-M0.  2. Left adrenal gland mass.  3. Recent bronchitis.  4. Chronic obstructive pulmonary disease.  5. Rheumatoid arthritis.  6. Possible cerebral aneurysm.  7. Spondylosis.  8. Hyperparathyroidism.  9. Hypertension.  10.      Endometriosis.  11.      Atherosclerosis with a small, 3.4-cm abdominal aortic aneurysm.  12.      Nephrolithiasis.  13.      History of detached retina.  14.      Cholecystectomy.  15.      Parathyroidectomy.   PROCEDURES:  Left VATS with thoracotomy and left upper lobectomy on June 05, 2002.   BRIEF HISTORY:  The patient is a 77 year old white female whose situation  began when she developed bronchitis in February of 2004.  Chest x-ray showed  a nodule, 1.8 cm, in the left upper lobe of her lung; this was confirmed on  CT scan.  It also incidentally showed a left adrenal gland mass, 3 x 3.8 cm;  this was going to be needle biopsied, but this was not possible, and it was  felt not to be wise to do this secondary to her body habitus.  She was  referred to Dr. Edwyna Shell, who saw her on May 09, 2002; he felt this lung mass  was likely a cancer and recommended a left upper lobe wedge resection.  He  also recommended further tests to follow up on the adrenal mass, which could  be done at a later date.   HOSPITAL COURSE:  She came into the  hospital for the procedure on June 05, 2002.  She tolerated the procedure well; there were no complications.  During her postop, she remained stable, and there were no major problems.  Pathology returned squamous cell cancer.  Oncology was consulted; Dr.  Darrold Span evaluated [name] and recommended appropriate followup.  This  included a PET scan after discharge and another CT scan to evaluate her left  adrenal mass.  Also, any possible consideration of chemotherapy would be  discussed after discharge.  Also, postop, [name] agreed to attempt to  quit smoking; a smoking cessation consult also was ordered.  Lines and tubes  were discontinued, as per routine.  On postop day  five, Jun 11, 2002, she  felt well, vital signs were stable, wounds were healing well, O2 sat was  satisfactory, chest x-ray was okay, she was discharged home in stable  condition.   DISCHARGE MEDICATIONS:  1. She was told to resume her home medicines, as before.  2. In addition, she was given home oxygen p.r.n.  3. Tylox one to two every four to six hours p.r.n. for pain.   ALLERGIES:  No known drug allergies.   CONDITION:  Stable.   DISPOSITION:  Home.   SPECIAL INSTRUCTIONS:  1. She was told to avoid driving, working, heavy lifting, or strenuous     activity.  2. She was told to use her breathing exerciser daily and to clean her wound     gently daily with soap and water.  3. She was told to get a chest x-ray at [facility] one hour before seeing Dr.     Edwyna Shell and to bring it with her to see Dr. Edwyna Shell.   FOLLOW UP:  1. Follow up with Dr. Edwyna Shell on Thursday, Jun 20, 2002, at 2:40 p.m.  2. Dr. Karilyn Cota for assistance with smoking cessation.  3. Dr. Darrold Span as directed.     [name], P.A.                          Ines Bloomer, M.D.    Alwyn Ren  D:  07/05/2002  T:  07/05/2002  Job:  [id number]   cc:   Wilson Singer, M.D.  [address]  [place]  [place] [address]  Fax: [phone]    Lennis P. Darrold Span, M.D.  [address] [facility]  [place]  [place] [address]  Fax: [phone]

## 2010-06-25 NOTE — H&P (Signed)
NAME:  [name], [name]                     ACCOUNT NO.:  [id number]   MEDICAL RECORD NO.:  [id number]                   PATIENT TYPE:  INP   LOCATION:  NA                                   FACILITY:  [facility]   PHYSICIAN:  Ines Bloomer, M.D.              DATE OF BIRTH:  [birth year]   DATE OF ADMISSION:  06/05/2002  DATE OF DISCHARGE:                                HISTORY & PHYSICAL   PHYSICIANS:  1. Wilson Singer, M.D., primary care physician.  2. Clinton D. Maple Hudson, M.D., pulmonology.  3. Payton Doughty, M.D., neurosurgery.   CHIEF COMPLAINT:  Left upper lobe lung lesion.   HISTORY OF PRESENT ILLNESS:  This is a 77 year old white female whose  situation began when she developed bronchitis in February of 2004.  Chest x-  ray at that time showed a nodule on the left upper lobe.  This was confirmed  on CT scan showing a 1.8 cm nodule with no adenopathy.  It also showed a  left adrenal gland mass 3 by 3.8 cm.  This was recommended to be needle  biopsied but this was unable to be done secondary to body habitus.  She was  referred to Dr. Ines Bloomer who saw her on May 09, 2002.  He felt the  lung mass was likely a cancer and recommended a left upper lobe wedge  resection.  He also recommended getting a CT scan later while she was in the  hospital if possible and also decide if the adrenal mass needs to be  needled.  He discussed this with her.   Today, the patient has no new complaints.  She has no feeling with regard to  her breathing.  She has had no new shortness of breath or cough.  No  hemoptysis.   PAST MEDICAL HISTORY:  1. Recent bout of bronchitis.  2. Chronic obstructive pulmonary disease.  3. Rheumatoid arthritis.  4. Possible cerebral aneurysm.  5. Spondylosis.  6. History of hyperparathyroidism.  7. Hypertension.  8. Endometriosis.  9. Atherosclerosis with a small 3.4 cm abdominal aortic aneurysm.  10.      Left adrenal nodule 3 by 3.8 cm.  11.      A  1.8 cm left upper lobe nodule on CT scan.  12.      Nephrolithiasis.   PAST SURGICAL HISTORY:  1. Parathyroidectomy January 2002.  2. Sebaceous cyst removal March of 2001.  3. Detached retina surgery in 1993.  4. Cholecystectomy in 1976.  5. Endometriosis surgery in 1955.   REVIEW OF SYMPTOMS:  Positive for dyspnea on exertion.  She denies  productive cough, shortness of breath with rest.  She denies hemoptysis.  She denies chest pain, palpitations.  She denies TIA symptoms.  She denies  claudication.  There is no history of diabetes.  No history of GERD.  She  does have frequency of urination and is able to  control her bladder.  She  has had a 19 pound weight loss with weight watchers and is very pleased with  that.   MEDICATIONS:  1. Methotrexate 10 mg every week.  2. Folic acid 1 mg q.d.  3. Mobic 7.5 mg q.d.  4. Caltrate 600 mg q.d.  5. She recently stopped taking Accuretic per Dr. Roylene Reason. Burney's     recommendation, 20/25 mg q.d.  6. She also recently stopped Norvasc per Dr. Roylene Reason. Burney's     recommendation at 2 mg q.d.   ALLERGIES:  No known drug allergies.   FAMILY HISTORY:  Mother deceased from renal failure and CVA at age 44.  Father deceased of unknown health history.  She has a brother deceased from  renal and heart problems at age 8.  There is a history of diabetes in a  grandmother.   SOCIAL HISTORY:  She is married with a supportive husband who is with her  here today.  She has smoked one to two packs per day for the last 48 years.  She denies alcohol use.  She has one child, female, age 37 who is healthy.  She is retired from office work.   PHYSICAL EXAMINATION:  VITAL SIGNS:  Height 5 feet 2-1/2 inches.  Weight 230  pounds.  Blood pressure 180/80, pulse is 90 and regular, respirations are  18.  GENERAL:  Well-nourished, obese, jolly white female in no acute distress who  appears her stated age.  Seems very robust and active.  HEENT:  Atraumatic.   Pupils are equal, round, and reactive to light.  Extraocular movements are intact.  Visual acuity is intact OU.  Oropharynx  is grossly normal.  NECK:  Supple.  There are no JVD and no bruits.  No lymphadenopathy.  There  are 2+ carotids and no bruits.  CHEST:  Symmetrical.  Breath sounds are clear and equal anterior and  posterior with no adventurous sounds.  CARDIOVASCULAR:  Regular rate and rhythm.  S1 and S2 with no murmurs, rubs,  or gallops.  ABDOMEN:  Obese, soft, and nontender with bowel sounds present.  No masses  felt and no bruits heard.  EXTREMITIES:  Warm and well perfused with no clubbing, cyanosis, or edema.  No ulcerations.  She has 1+ dorsalis pedis pulses.  Feet are warm and pink.  Upper extremity pulses are 2+.  NEUROLOGICAL:  Grossly nonfocal.  Gait is steady.  Muscular strength is +5/5  throughout.  Cranial nerves are intact.    ASSESSMENT/PLAN:  This is a 77 year old white female heavy smoker with a  recently discovered left upper lobe lung nodule and incidentally discovered  left renal gland mass.  She is scheduled for left video-assisted  thoracoscopy and right upper lobe wedge resection by Dr. Ines Bloomer at  [facility] on June 05, 2002.     [name], P.A.                          Ines Bloomer, M.D.    Alwyn Ren  D:  06/03/2002  T:  06/03/2002  Job:  [id number]   cc:   Wilson Singer, M.D.  [address]  [place]  [place] [address]  Fax: [phone]   Clinton D. Young, M.D.  [address] [place]  [place] [address]  Fax: [phone]

## 2010-10-19 ENCOUNTER — Encounter (HOSPITAL_BASED_OUTPATIENT_CLINIC_OR_DEPARTMENT_OTHER): Payer: Medicare Other | Admitting: Internal Medicine

## 2010-10-19 ENCOUNTER — Other Ambulatory Visit: Payer: Self-pay | Admitting: Internal Medicine

## 2010-10-19 DIAGNOSIS — C3430 Malignant neoplasm of lower lobe, unspecified bronchus or lung: Secondary | ICD-10-CM

## 2010-10-19 LAB — CBC WITH DIFFERENTIAL/PLATELET
Basophils Absolute: 0 10*3/uL (ref 0.0–0.1)
EOS%: 1.9 % (ref 0.0–7.0)
HGB: 11.8 g/dL (ref 11.6–15.9)
LYMPH%: 20.5 % (ref 14.0–49.7)
MCH: 32 pg (ref 25.1–34.0)
MCV: 94.3 fL (ref 79.5–101.0)
MONO%: 12.7 % (ref 0.0–14.0)
Platelets: 184 10*3/uL (ref 145–400)
RBC: 3.69 10*6/uL — ABNORMAL LOW (ref 3.70–5.45)
RDW: 15.5 % — ABNORMAL HIGH (ref 11.2–14.5)

## 2010-10-19 LAB — CMP (CANCER CENTER ONLY)
AST: 17 U/L (ref 11–38)
Albumin: 3.8 g/dL (ref 3.3–5.5)
Alkaline Phosphatase: 55 U/L (ref 26–84)
BUN, Bld: 25 mg/dL — ABNORMAL HIGH (ref 7–22)
Potassium: 4.2 mEq/L (ref 3.3–4.7)
Total Bilirubin: 0.5 mg/dl (ref 0.20–1.60)

## 2010-10-20 ENCOUNTER — Ambulatory Visit (HOSPITAL_COMMUNITY)
Admission: RE | Admit: 2010-10-20 | Discharge: 2010-10-20 | Disposition: A | Discharge status: Home / Self Care | Payer: Medicare Other | Source: Ambulatory Visit | Attending: Internal Medicine | Admitting: Internal Medicine

## 2010-10-20 DIAGNOSIS — Z85118 Personal history of other malignant neoplasm of bronchus and lung: Secondary | ICD-10-CM

## 2010-10-20 DIAGNOSIS — C3490 Malignant neoplasm of unspecified part of unspecified bronchus or lung: Secondary | ICD-10-CM | POA: Insufficient documentation

## 2010-10-20 DIAGNOSIS — K863 Pseudocyst of pancreas: Secondary | ICD-10-CM | POA: Insufficient documentation

## 2010-10-20 DIAGNOSIS — I714 Abdominal aortic aneurysm, without rupture, unspecified: Secondary | ICD-10-CM | POA: Insufficient documentation

## 2010-10-20 DIAGNOSIS — E278 Other specified disorders of adrenal gland: Secondary | ICD-10-CM | POA: Insufficient documentation

## 2010-10-20 DIAGNOSIS — K862 Cyst of pancreas: Secondary | ICD-10-CM | POA: Insufficient documentation

## 2010-10-20 MED ORDER — IOHEXOL 300 MG/ML  SOLN
80.0000 mL | Freq: Once | INTRAMUSCULAR | Status: AC | PRN
  Administered 2010-10-20: 80 mL via INTRAVENOUS

## 2010-10-25 DIAGNOSIS — I10 Essential (primary) hypertension: Secondary | ICD-10-CM

## 2010-10-25 DIAGNOSIS — I714 Abdominal aortic aneurysm, without rupture, unspecified: Secondary | ICD-10-CM

## 2010-10-25 DIAGNOSIS — E7800 Pure hypercholesterolemia, unspecified: Secondary | ICD-10-CM

## 2010-10-25 DIAGNOSIS — I7090 Unspecified atherosclerosis: Secondary | ICD-10-CM

## 2010-10-25 DIAGNOSIS — C3490 Malignant neoplasm of unspecified part of unspecified bronchus or lung: Secondary | ICD-10-CM

## 2010-10-25 DIAGNOSIS — J449 Chronic obstructive pulmonary disease, unspecified: Secondary | ICD-10-CM

## 2010-10-25 DIAGNOSIS — N809 Endometriosis, unspecified: Secondary | ICD-10-CM | POA: Insufficient documentation

## 2010-10-25 DIAGNOSIS — M069 Rheumatoid arthritis, unspecified: Secondary | ICD-10-CM

## 2010-10-25 DIAGNOSIS — E213 Hyperparathyroidism, unspecified: Secondary | ICD-10-CM | POA: Insufficient documentation

## 2010-10-26 ENCOUNTER — Encounter: Payer: Self-pay | Admitting: Thoracic Surgery

## 2010-10-26 ENCOUNTER — Ambulatory Visit (INDEPENDENT_AMBULATORY_CARE_PROVIDER_SITE_OTHER): Payer: Medicare Other | Admitting: Thoracic Surgery

## 2010-10-26 VITALS — BP 124/58 | HR 84 | Resp 20 | Ht 59.0 in | Wt 245.0 lb

## 2010-10-26 DIAGNOSIS — C3490 Malignant neoplasm of unspecified part of unspecified bronchus or lung: Secondary | ICD-10-CM

## 2010-10-26 NOTE — Progress Notes (Signed)
HPI patient returns for followup of her CT scan. There is no evidence of recurrence of her cancer. He will see Dr. Gwenyth Bouillon. She is medically stable overall. We will see her back again in 6 months  Current Outpatient Prescriptions  Medication Sig Dispense Refill  . amlodipine-atorvastatin (CADUET) 10-20 MG per tablet Take 1 tablet by mouth daily.        . clotrimazole-betamethasone (LOTRISONE) cream       . dexlansoprazole (DEXILANT) 60 MG capsule Take 60 mg by mouth daily.        . ergocalciferol (VITAMIN D2) 50000 UNITS capsule Take 50,000 Units by mouth once a week.        . hydrochlorothiazide (MICROZIDE) 12.5 MG capsule Take 12.5 mg by mouth daily.        . metformin (FORTAMET) 500 MG (OSM) 24 hr tablet Take 500 mg by mouth 2 (two) times daily with a meal.        . metoprolol (TOPROL-XL) 50 MG 24 hr tablet Take 50 mg by mouth daily.        . vitamin E (VITAMIN E) 400 UNIT capsule Take 400 Units by mouth daily.        Marland Kitchen terbinafine (LAMISIL) 250 MG tablet Take 250 mg by mouth daily.           Review of Systems: Note   Physical Exam  Cardiovascular: Normal rate, regular rhythm, normal heart sounds and intact distal pulses.   Pulmonary/Chest: Effort normal and breath sounds normal.   she is on oxygen.   Diagnostic Tests: CT scan showed no evidence for recurrence of recurrence of her cancer. Her adrenal glands are stable.  Impression: Status post non-small cell and small cell lung cancer treated.   Plan:

## 2010-10-27 LAB — PREPARE PLATELET PHERESIS

## 2010-11-08 ENCOUNTER — Encounter: Payer: Medicare Other | Admitting: Internal Medicine

## 2010-11-09 LAB — CREATININE, SERUM: Creatinine, Ser: 1.17

## 2010-11-10 LAB — CBC
HCT: 40.1
Hemoglobin: 13.3
MCV: 87.4
RBC: 4.59
WBC: 8.2

## 2010-11-10 LAB — ABO/RH: ABO/RH(D): A POS

## 2010-11-10 LAB — COMPREHENSIVE METABOLIC PANEL
BUN: 31 — ABNORMAL HIGH
CO2: 31
Chloride: 102
Creatinine, Ser: 1.16
GFR calc non Af Amer: 46 — ABNORMAL LOW
Total Bilirubin: 0.8

## 2010-11-10 LAB — CULTURE, RESPIRATORY W GRAM STAIN

## 2010-11-10 LAB — GLUCOSE, CAPILLARY: Glucose-Capillary: 182 — ABNORMAL HIGH

## 2010-11-10 LAB — PROTIME-INR: INR: 0.9

## 2010-11-10 LAB — APTT: aPTT: 26

## 2010-11-12 LAB — GLUCOSE, CAPILLARY
Glucose-Capillary: 142 mg/dL — ABNORMAL HIGH (ref 70–99)
Glucose-Capillary: 165 mg/dL — ABNORMAL HIGH (ref 70–99)
Glucose-Capillary: 185 mg/dL — ABNORMAL HIGH (ref 70–99)
Glucose-Capillary: 190 mg/dL — ABNORMAL HIGH (ref 70–99)

## 2010-11-12 LAB — CARDIAC PANEL(CRET KIN+CKTOT+MB+TROPI)
CK, MB: 3 ng/mL (ref 0.3–4.0)
Relative Index: INVALID (ref 0.0–2.5)
Total CK: 44 U/L (ref 7–177)
Total CK: 49 U/L (ref 7–177)
Troponin I: 0.03 ng/mL (ref 0.00–0.06)
Troponin I: 0.04 ng/mL (ref 0.00–0.06)

## 2010-11-12 LAB — COMPREHENSIVE METABOLIC PANEL
Alkaline Phosphatase: 46 U/L (ref 39–117)
BUN: 27 mg/dL — ABNORMAL HIGH (ref 6–23)
BUN: 32 mg/dL — ABNORMAL HIGH (ref 6–23)
CO2: 27 mEq/L (ref 19–32)
Calcium: 8.6 mg/dL (ref 8.4–10.5)
Chloride: 107 mEq/L (ref 96–112)
Creatinine, Ser: 1.22 mg/dL — ABNORMAL HIGH (ref 0.4–1.2)
GFR calc non Af Amer: 43 mL/min — ABNORMAL LOW (ref >60)
Glucose, Bld: 135 mg/dL — ABNORMAL HIGH (ref 70–99)
Glucose, Bld: 174 mg/dL — ABNORMAL HIGH (ref 70–99)
Sodium: 138 mEq/L (ref 135–145)
Total Bilirubin: 0.5 mg/dL (ref 0.3–1.2)
Total Protein: 6.3 g/dL (ref 6.0–8.3)

## 2010-11-12 LAB — TSH: TSH: 0.017 u[IU]/mL — ABNORMAL LOW (ref 0.350–4.500)

## 2010-11-12 LAB — CBC
HCT: 24.6 % — ABNORMAL LOW (ref 36.0–46.0)
Hemoglobin: 10.1 g/dL — ABNORMAL LOW (ref 12.0–15.0)
Hemoglobin: 8.4 g/dL — ABNORMAL LOW (ref 12.0–15.0)
MCHC: 33.7 g/dL (ref 30.0–36.0)
RBC: 2.85 MIL/uL — ABNORMAL LOW (ref 3.87–5.11)
RDW: 14.1 % (ref 11.5–15.5)
RDW: 14.6 % (ref 11.5–15.5)

## 2010-11-12 LAB — DIFFERENTIAL
Basophils Absolute: 0 10*3/uL (ref 0.0–0.1)
Basophils Relative: 1 % (ref 0–1)
Eosinophils Relative: 0 % (ref 0–5)
Eosinophils Relative: 0 % (ref 0–5)
Lymphocytes Relative: 1 % — ABNORMAL LOW (ref 12–46)
Lymphocytes Relative: 4 % — ABNORMAL LOW (ref 12–46)
Monocytes Absolute: 0.2 10*3/uL (ref 0.1–1.0)
Monocytes Relative: 3 % (ref 3–12)
Neutrophils Relative %: 97 % — ABNORMAL HIGH (ref 43–77)

## 2010-11-12 LAB — ABO/RH: ABO/RH(D): A POS

## 2010-11-12 LAB — BASIC METABOLIC PANEL
Calcium: 8.3 mg/dL — ABNORMAL LOW (ref 8.4–10.5)
Creatinine, Ser: 1.23 mg/dL — ABNORMAL HIGH (ref 0.4–1.2)
GFR calc Af Amer: 52 mL/min — ABNORMAL LOW (ref >60)

## 2010-11-12 LAB — CROSSMATCH
ABO/RH(D): A POS
Antibody Screen: NEGATIVE

## 2010-11-12 LAB — B-NATRIURETIC PEPTIDE (CONVERTED LAB): Pro B Natriuretic peptide (BNP): 515 pg/mL — ABNORMAL HIGH (ref 0.0–100.0)

## 2011-03-03 ENCOUNTER — Other Ambulatory Visit: Payer: Self-pay | Admitting: Internal Medicine

## 2011-03-03 DIAGNOSIS — C3490 Malignant neoplasm of unspecified part of unspecified bronchus or lung: Secondary | ICD-10-CM

## 2011-05-02 ENCOUNTER — Other Ambulatory Visit (HOSPITAL_BASED_OUTPATIENT_CLINIC_OR_DEPARTMENT_OTHER): Payer: Medicare Other | Admitting: Lab

## 2011-05-02 DIAGNOSIS — C3430 Malignant neoplasm of lower lobe, unspecified bronchus or lung: Secondary | ICD-10-CM

## 2011-05-02 LAB — COMPREHENSIVE METABOLIC PANEL
AST: 14 U/L (ref 0–37)
Albumin: 4.4 g/dL (ref 3.5–5.2)
Alkaline Phosphatase: 53 U/L (ref 39–117)
BUN: 21 mg/dL (ref 6–23)
Glucose, Bld: 134 mg/dL — ABNORMAL HIGH (ref 70–99)
Potassium: 4.3 mEq/L (ref 3.5–5.3)
Sodium: 143 mEq/L (ref 135–145)
Total Bilirubin: 0.5 mg/dL (ref 0.3–1.2)

## 2011-05-02 LAB — CBC WITH DIFFERENTIAL/PLATELET
Basophils Absolute: 0 10*3/uL (ref 0.0–0.1)
EOS%: 4.1 % (ref 0.0–7.0)
Eosinophils Absolute: 0.3 10*3/uL (ref 0.0–0.5)
LYMPH%: 15.5 % (ref 14.0–49.7)
MCH: 32 pg (ref 25.1–34.0)
MCV: 95.7 fL (ref 79.5–101.0)
MONO%: 13.2 % (ref 0.0–14.0)
Platelets: 233 10*3/uL (ref 145–400)
RBC: 3.65 10*6/uL — ABNORMAL LOW (ref 3.70–5.45)
RDW: 15 % — ABNORMAL HIGH (ref 11.2–14.5)

## 2011-05-03 ENCOUNTER — Ambulatory Visit (HOSPITAL_COMMUNITY)
Admission: RE | Admit: 2011-05-03 | Discharge: 2011-05-03 | Disposition: A | Discharge status: Home / Self Care | Payer: Medicare Other | Source: Ambulatory Visit | Attending: Internal Medicine | Admitting: Internal Medicine

## 2011-05-03 ENCOUNTER — Other Ambulatory Visit: Payer: Self-pay | Admitting: Internal Medicine

## 2011-05-03 ENCOUNTER — Other Ambulatory Visit: Payer: Medicare Other | Admitting: Lab

## 2011-05-03 ENCOUNTER — Encounter: Payer: Self-pay | Admitting: *Deleted

## 2011-05-03 DIAGNOSIS — I714 Abdominal aortic aneurysm, without rupture, unspecified: Secondary | ICD-10-CM | POA: Insufficient documentation

## 2011-05-03 DIAGNOSIS — I2510 Atherosclerotic heart disease of native coronary artery without angina pectoris: Secondary | ICD-10-CM | POA: Insufficient documentation

## 2011-05-03 DIAGNOSIS — C3490 Malignant neoplasm of unspecified part of unspecified bronchus or lung: Secondary | ICD-10-CM | POA: Insufficient documentation

## 2011-05-03 DIAGNOSIS — K8689 Other specified diseases of pancreas: Secondary | ICD-10-CM | POA: Insufficient documentation

## 2011-05-03 DIAGNOSIS — Z9089 Acquired absence of other organs: Secondary | ICD-10-CM | POA: Insufficient documentation

## 2011-05-03 NOTE — Progress Notes (Signed)
RECEIVED A FAX FROM [facility], JANE TILLEY. THIS REPORT WAS GIVEN TO DR.MOHAMED.

## 2011-05-03 NOTE — Progress Notes (Signed)
Quick Note:  Call patient and see if she has any respiratory symptoms. ______

## 2011-05-04 ENCOUNTER — Telehealth: Payer: Self-pay | Admitting: Medical Oncology

## 2011-05-04 NOTE — Telephone Encounter (Signed)
Pt states her breathing is not any better and sometimes feels like she cannot "' get enough oxygen" She has increased her oxygen to 3 liters and if she goes out bumps it up to 4 liters . She asked if her scan came back . I will notify Dr Donnald Garre.

## 2011-05-09 ENCOUNTER — Telehealth: Payer: Self-pay | Admitting: Internal Medicine

## 2011-05-09 ENCOUNTER — Ambulatory Visit (HOSPITAL_BASED_OUTPATIENT_CLINIC_OR_DEPARTMENT_OTHER): Payer: Medicare Other | Admitting: Internal Medicine

## 2011-05-09 VITALS — BP 130/71 | HR 93 | Temp 95.8°F | Ht 59.0 in | Wt 236.8 lb

## 2011-05-09 DIAGNOSIS — C3490 Malignant neoplasm of unspecified part of unspecified bronchus or lung: Secondary | ICD-10-CM

## 2011-05-09 NOTE — Progress Notes (Signed)
[facility] Telephone:([phone]) [phone]   [phone]  OFFICE PROGRESS NOTE  Ralene Ok, MD, MD [address] [place] [address]  PRINCIPAL DIAGNOSES:   1. Limited-stage small-cell lung cancer diagnosed in November 2009. 2. History of stage IA non-small cell lung cancer, squamous cell carcinoma diagnosed in April 2004.  PRIOR THERAPY:   1. Status post left upper lobectomy under the care of Dr. Edwyna Shell on January 05, 2003. 2. Status post 4 cycles of systemic chemotherapy with carboplatin and etoposide.  Last dose was given April 02, 2008.  CURRENT THERAPY:  Observation.  INTERVAL HISTORY: [name] 78 y.o. female returns to the clinic today for routine six-month followup visit. She was accompanied by her husband. The patient continues to have shortness of breath at baseline and currently on home oxygen 3 L per minute. She also has cough productive for thick sputum. No significant chest pain. No weight loss or night sweats. No recent fever or chills. She has repeat CT scan of the chest performed recently and she is here today for evaluation and discussion of her scan results.  MEDICAL HISTORY: Past Medical History  Diagnosis Date  . Lung cancer     lung ca dx 2004/2009  . Diabetes mellitus   . Hypertension   . COPD (chronic obstructive pulmonary disease)   . Rheumatoid arthritis   . Hyperparathyroidism   . Endometriosis     HX  . Hypercholesterolemia   . AAA (abdominal aortic aneurysm)     3.4cm  . Atherosclerotic vascular disease     ALLERGIES:   has no known allergies.  MEDICATIONS:  Current Outpatient Prescriptions  Medication Sig Dispense Refill  . amlodipine-atorvastatin (CADUET) 10-20 MG per tablet Take 1 tablet by mouth daily.        Marland Kitchen aspirin 81 MG tablet Take 81 mg by mouth daily.      Marland Kitchen dexlansoprazole (DEXILANT) 60 MG capsule Take 60 mg by mouth daily as needed.       . ergocalciferol (VITAMIN D2) 50000 UNITS  capsule Take 50,000 Units by mouth once a week.        . hydrochlorothiazide (MICROZIDE) 12.5 MG capsule Take 12.5 mg by mouth daily.        . metformin (FORTAMET) 500 MG (OSM) 24 hr tablet Take 500 mg by mouth 2 (two) times daily with a meal.        . metoprolol (TOPROL-XL) 50 MG 24 hr tablet Take 50 mg by mouth daily.        . clotrimazole-betamethasone (LOTRISONE) cream       . terbinafine (LAMISIL) 250 MG tablet Take 250 mg by mouth daily.        . vitamin E (VITAMIN E) 400 UNIT capsule Take 400 Units by mouth daily.          SURGICAL HISTORY:  Past Surgical History  Procedure Date  . Cholecystectomy   . Appendectomy   . S/p left upper lobectomy 2004  . Parathroidectomy 2002  . Retinal detachment surgery 1993    REVIEW OF SYSTEMS:  A comprehensive review of systems was negative except for: Respiratory: positive for cough and dyspnea on exertion   PHYSICAL EXAMINATION: General appearance: alert, cooperative, fatigued and no distress Head: Normocephalic, without obvious abnormality, atraumatic Neck: no adenopathy Lymph nodes: Cervical, supraclavicular, and axillary nodes normal. Resp: clear to auscultation bilaterally Cardio: regular rate and rhythm, S1, S2 normal, no murmur, click, rub or gallop GI: soft, non-tender;  bowel sounds normal; no masses,  no organomegaly Extremities: extremities normal, atraumatic, no cyanosis or edema Neurologic: Alert and oriented X 3, normal strength and tone. Normal symmetric reflexes. Normal coordination and gait  ECOG PERFORMANCE STATUS: 1 - Symptomatic but completely ambulatory  Blood pressure 130/71, pulse 93, temperature 95.8 F (35.4 C), temperature source Oral, height 4\' 11"  (1.499 m), weight 236 lb 12.8 oz (107.412 kg).  LABORATORY DATA: Lab Results  Component Value Date   WBC 7.3 05/02/2011   HGB 11.7 05/02/2011   HCT 34.9 05/02/2011   MCV 95.7 05/02/2011   PLT 233 05/02/2011      Chemistry      Component Value Date/Time   NA 143  05/02/2011 1412   NA 139 10/19/2010 1501   K 4.3 05/02/2011 1412   K 4.2 10/19/2010 1501   CL 95* 05/02/2011 1412   CL 94* 10/19/2010 1501   CO2 37* 05/02/2011 1412   CO2 29 10/19/2010 1501   BUN 21 05/02/2011 1412   BUN 25* 10/19/2010 1501   CREATININE 1.02 05/02/2011 1412   CREATININE 0.8 10/19/2010 1501      Component Value Date/Time   CALCIUM 10.7* 05/02/2011 1412   CALCIUM 10.0 10/19/2010 1501   ALKPHOS 53 05/02/2011 1412   ALKPHOS 55 10/19/2010 1501   AST 14 05/02/2011 1412   AST 17 10/19/2010 1501   ALT 11 05/02/2011 1412   BILITOT 0.5 05/02/2011 1412   BILITOT 0.50 10/19/2010 1501       RADIOGRAPHIC STUDIES: Ct Chest Wo Contrast  05/03/2011  *RADIOLOGY REPORT*  Clinical Data: History of lung cancer diagnosed in 2004 and 2009 status post chemotherapy and radiation therapy.  CT CHEST WITHOUT CONTRAST  Technique:  Multidetector CT imaging of the chest was performed following the standard protocol without IV contrast.  Comparison: Multiple priors, most recently a chest CT 10/20/2010.  Findings:  Mediastinum: Heart size is normal. There is no significant pericardial fluid, thickening or pericardial calcification. There is atherosclerosis of the thoracic aorta, the great vessels of the mediastinum and the coronary arteries, including calcified atherosclerotic plaque in the left main, left anterior descending, left circumflex and right coronary arteries. No pathologically enlarged mediastinal or hilar lymph nodes. Please note that accurate exclusion of hilar adenopathy is limited on noncontrast CT scans.  Esophagus is unremarkable in appearance.  Lungs/Pleura: Status post left upper lobectomy.  There is compensatory hyperexpansion of the left lower lobe.  Mild diffuse bronchial wall thickening with some peribronchovascular micronodularity and ground-glass attenuation throughout the medial aspect of the right lung, likely reflects some mucoid impaction within terminal bronchioles, possibly indicative of a  low grade infection of the airways.  No definite suspicious appearing pulmonary nodules or masses are identified.  No pleural effusions.  Upper Abdomen: Status post cholecystectomy.  Unchanged 3.9 x 3.2 cm left adrenal mass which has internal areas of intermediate (26 HU) and a low (7 HU) attenuation, compared to prior examination from 04/04/2006, suggesting a benign lesion such as a lipid poor adenoma.  2 cm low attenuation lesion in the distal body of the pancreas is unchanged compared to recent prior examinations, and is incompletely characterized on today's study.  Extensive atherosclerosis.  The abdominal aorta is aneurysmal at the level of the renal artery origins (4.3 x 4.1 cm), similar compared to prior examination.  Musculoskeletal: There are no aggressive appearing lytic or blastic lesions noted in the visualized portions of the skeleton.  IMPRESSION: 1.  No findings to suggest residual or recurrent  disease on today's examination. 2.  Regions of bronchial wall thickening with peribronchovascular micronodularity and ground-glass attenuation throughout the medial aspect of the right upper lobe and superior segment of the right lower lobe.  The appearance of this could suggest a low grade infection of the airways such as a mild bronchopneumonia.  Clinical correlation is recommended 3. Atherosclerosis, including left main and three-vessel coronary artery disease. Please note that although the presence of coronary artery calcium documents the presence of coronary artery disease, the severity of this disease and any potential stenosis cannot be assessed on this non-gated CT examination. In addition, there is an upper abdominal aortic aneurysm (at the level of the renal artery origins) measuring 4.3 x 4.1 cm, which is similar to prior studies. Assessment for potential risk factor modification, dietary therapy or pharmacologic therapy may be warranted, if clinically indicated. 4.  Unchanged 3.9 x 3.2 cm left  adrenal mass which likely represents a lipid poor adenoma. 5.  Unchanged 2 cm low attenuation lesion in the distal body of the pancreas which is incompletely characterized on today's examination. 6.  Status post cholecystectomy.  Original Report Authenticated By: Florencia Reasons, M.D.    ASSESSMENT: This is a very pleasant 78 years old white female with history of limited stage small cell lung cancer status post systemic chemotherapy with carboplatin and etoposide. She has been observation since April 2010 with no evidence for disease recurrence   PLAN: I discussed the scan results with the patient. I recommended for her continuous observation for now with repeat CT scan of the chest with contrast in 6 months. She was advised to call me immediately if she has any concerning symptoms in the interval.  All questions were answered. The patient knows to call the clinic with any problems, questions or concerns. We can certainly see the patient much sooner if necessary.

## 2011-05-09 NOTE — Telephone Encounter (Signed)
gve the pt her oct 2013 appt calendar along with the ct scan appt °

## 2011-05-10 ENCOUNTER — Encounter: Payer: Self-pay | Admitting: Thoracic Surgery

## 2011-05-10 ENCOUNTER — Ambulatory Visit (INDEPENDENT_AMBULATORY_CARE_PROVIDER_SITE_OTHER): Payer: Medicare Other | Admitting: Thoracic Surgery

## 2011-05-10 VITALS — BP 107/65 | HR 78 | Resp 16

## 2011-05-10 DIAGNOSIS — Z85118 Personal history of other malignant neoplasm of bronchus and lung: Secondary | ICD-10-CM

## 2011-05-10 DIAGNOSIS — Z09 Encounter for follow-up examination after completed treatment for conditions other than malignant neoplasm: Secondary | ICD-10-CM

## 2011-05-10 NOTE — Progress Notes (Signed)
HPI is an 94 of the pleura medially with her and her scans are negative for recurrence of her cancer. She's now 3-1/2 units years since her small his cell cancer and almost 10 years since her non-small cell lung cancer and the left lower lobe she is is requires oxygen but is doing well overall. I will let her be  followed by Dr. Gwenyth Bouillon and see her again as needed.   Current Outpatient Prescriptions  Medication Sig Dispense Refill  . amlodipine-atorvastatin (CADUET) 10-20 MG per tablet Take 1 tablet by mouth daily.        Marland Kitchen aspirin 81 MG tablet Take 81 mg by mouth daily.      . clotrimazole-betamethasone (LOTRISONE) cream       . dexlansoprazole (DEXILANT) 60 MG capsule Take 60 mg by mouth daily as needed.       . ergocalciferol (VITAMIN D2) 50000 UNITS capsule Take 50,000 Units by mouth once a week.        . hydrochlorothiazide (MICROZIDE) 12.5 MG capsule Take 12.5 mg by mouth daily.        . metformin (FORTAMET) 500 MG (OSM) 24 hr tablet Take 500 mg by mouth 3 (three) times daily with meals.       . metoprolol (TOPROL-XL) 50 MG 24 hr tablet Take 50 mg by mouth daily.        Marland Kitchen terbinafine (LAMISIL) 250 MG tablet Take 250 mg by mouth daily.           Review of Systems: Stable   Physical Exam lungs are clear to auscultation percussion   Diagnostic Tests: CT scan is negative for recurrence of cancer   Impression: Status post resection of stage IA non-small cell lung cancer and treatment for small cell lung   Plan: Return as needed

## 2011-10-19 ENCOUNTER — Telehealth: Payer: Self-pay | Admitting: Medical Oncology

## 2011-10-19 NOTE — Telephone Encounter (Signed)
Clarifying need to stop eyedrops for lab tests.- I told her she does not need to stop meds for lab tests.Marland Kitchen

## 2011-11-10 ENCOUNTER — Other Ambulatory Visit (HOSPITAL_BASED_OUTPATIENT_CLINIC_OR_DEPARTMENT_OTHER): Payer: Medicare Other | Admitting: Lab

## 2011-11-10 DIAGNOSIS — C3490 Malignant neoplasm of unspecified part of unspecified bronchus or lung: Secondary | ICD-10-CM

## 2011-11-10 DIAGNOSIS — C3430 Malignant neoplasm of lower lobe, unspecified bronchus or lung: Secondary | ICD-10-CM

## 2011-11-10 LAB — COMPREHENSIVE METABOLIC PANEL (CC13)
Albumin: 3.5 g/dL (ref 3.5–5.0)
Alkaline Phosphatase: 70 U/L (ref 40–150)
BUN: 23 mg/dL (ref 7.0–26.0)
CO2: 31 mEq/L — ABNORMAL HIGH (ref 22–29)
Calcium: 10.1 mg/dL (ref 8.4–10.4)
Chloride: 99 mEq/L (ref 98–107)
Glucose: 115 mg/dl — ABNORMAL HIGH (ref 70–99)
Potassium: 4.2 mEq/L (ref 3.5–5.1)
Sodium: 142 mEq/L (ref 136–145)
Total Protein: 6.4 g/dL (ref 6.4–8.3)

## 2011-11-10 LAB — CBC WITH DIFFERENTIAL/PLATELET
Eosinophils Absolute: 0.2 10*3/uL (ref 0.0–0.5)
MCV: 94.7 fL (ref 79.5–101.0)
MONO%: 13.1 % (ref 0.0–14.0)
NEUT#: 4.6 10*3/uL (ref 1.5–6.5)
RBC: 3.6 10*6/uL — ABNORMAL LOW (ref 3.70–5.45)
RDW: 15.5 % — ABNORMAL HIGH (ref 11.2–14.5)
WBC: 7.1 10*3/uL (ref 3.9–10.3)
lymph#: 1.4 10*3/uL (ref 0.9–3.3)

## 2011-11-11 ENCOUNTER — Other Ambulatory Visit: Payer: Medicare Other

## 2011-11-11 ENCOUNTER — Ambulatory Visit (HOSPITAL_COMMUNITY)
Admission: RE | Admit: 2011-11-11 | Discharge: 2011-11-11 | Disposition: A | Discharge status: Home / Self Care | Payer: Medicare Other | Source: Ambulatory Visit | Attending: Internal Medicine | Admitting: Internal Medicine

## 2011-11-11 DIAGNOSIS — I517 Cardiomegaly: Secondary | ICD-10-CM | POA: Insufficient documentation

## 2011-11-11 DIAGNOSIS — R0602 Shortness of breath: Secondary | ICD-10-CM | POA: Insufficient documentation

## 2011-11-11 DIAGNOSIS — I714 Abdominal aortic aneurysm, without rupture, unspecified: Secondary | ICD-10-CM | POA: Insufficient documentation

## 2011-11-11 DIAGNOSIS — C3490 Malignant neoplasm of unspecified part of unspecified bronchus or lung: Secondary | ICD-10-CM | POA: Insufficient documentation

## 2011-11-11 DIAGNOSIS — K7689 Other specified diseases of liver: Secondary | ICD-10-CM | POA: Insufficient documentation

## 2011-11-11 DIAGNOSIS — E278 Other specified disorders of adrenal gland: Secondary | ICD-10-CM | POA: Insufficient documentation

## 2011-11-11 DIAGNOSIS — E042 Nontoxic multinodular goiter: Secondary | ICD-10-CM | POA: Insufficient documentation

## 2011-11-11 MED ORDER — IOHEXOL 300 MG/ML  SOLN
80.0000 mL | Freq: Once | INTRAMUSCULAR | Status: AC | PRN
  Administered 2011-11-11: 80 mL via INTRAVENOUS

## 2011-11-15 ENCOUNTER — Ambulatory Visit (HOSPITAL_BASED_OUTPATIENT_CLINIC_OR_DEPARTMENT_OTHER): Payer: Medicare Other | Admitting: Internal Medicine

## 2011-11-15 ENCOUNTER — Telehealth: Payer: Self-pay | Admitting: Internal Medicine

## 2011-11-15 VITALS — BP 132/76 | HR 89 | Temp 96.9°F | Resp 20 | Ht 59.0 in | Wt 236.7 lb

## 2011-11-15 DIAGNOSIS — C3490 Malignant neoplasm of unspecified part of unspecified bronchus or lung: Secondary | ICD-10-CM

## 2011-11-15 NOTE — Patient Instructions (Signed)
You have no evidence for disease recurrence. Followup visit in one year with repeat CT scan of the chest. Please call if you have any concerning symptoms in the interval.

## 2011-11-15 NOTE — Telephone Encounter (Signed)
no answer no vm....mailed Oct 2014 appt calander to pt.

## 2011-11-15 NOTE — Progress Notes (Signed)
[facility] [phone]   Fax:(336) [phone]  OFFICE PROGRESS NOTE  Ralene Ok, MD [address] [place] [address]  PRINCIPAL DIAGNOSES:  1. Limited-stage small-cell lung cancer diagnosed in November 2009. 2. History of stage IA non-small cell lung cancer, squamous cell carcinoma diagnosed in April 2004.  PRIOR THERAPY:  1. Status post left upper lobectomy under the care of Dr. Edwyna Shell on January 05, 2003. 2. Status post 4 cycles of systemic chemotherapy with carboplatin and etoposide. Last dose was given April 02, 2008.  CURRENT THERAPY: Observation.  INTERVAL HISTORY: [name] 78 y.o. female returns to the clinic today for followup visit accompanied her husband. The patient is feeling fine today with no specific complaints except for the baseline shortness breath increased with exertion. She recently underwent cataract surgery. The patient denied having any significant chest pain, cough or hemoptysis. She denied having any significant weight loss or night sweats. The patient has repeat CT scan of the chest performed recently and she is here today for evaluation and discussion of her scan results.  MEDICAL HISTORY: Past Medical History  Diagnosis Date  . Lung cancer     lung ca dx 2004/2009  . Diabetes mellitus   . Hypertension   . COPD (chronic obstructive pulmonary disease)   . Rheumatoid arthritis   . Hyperparathyroidism   . Endometriosis     HX  . Hypercholesterolemia   . AAA (abdominal aortic aneurysm)     3.4cm  . Atherosclerotic vascular disease     ALLERGIES:   has no known allergies.  MEDICATIONS:  Current Outpatient Prescriptions  Medication Sig Dispense Refill  . amlodipine-atorvastatin (CADUET) 10-20 MG per tablet Take 1 tablet by mouth daily.        Marland Kitchen aspirin 81 MG tablet Take 81 mg by mouth daily.      . clotrimazole-betamethasone (LOTRISONE) cream       . dexlansoprazole (DEXILANT) 60 MG capsule  Take 60 mg by mouth daily as needed.       . ergocalciferol (VITAMIN D2) 50000 UNITS capsule Take 50,000 Units by mouth once a week.        . hydrochlorothiazide (MICROZIDE) 12.5 MG capsule Take 12.5 mg by mouth daily.        . metformin (FORTAMET) 500 MG (OSM) 24 hr tablet Take 500 mg by mouth 3 (three) times daily with meals.       . metoprolol (TOPROL-XL) 50 MG 24 hr tablet Take 50 mg by mouth daily.        Marland Kitchen terbinafine (LAMISIL) 250 MG tablet Take 250 mg by mouth daily.        . ILEVRO 0.3 % SUSP Place 1 drop into the left eye Daily.      . prednisoLONE acetate (PRED FORTE) 1 % ophthalmic suspension Place 3 drops into the left eye Three times a day.        SURGICAL HISTORY:  Past Surgical History  Procedure Date  . Cholecystectomy   . Appendectomy   . S/p left upper lobectomy 2004  . Parathroidectomy 2002  . Retinal detachment surgery 1993    REVIEW OF SYSTEMS:  A comprehensive review of systems was negative except for: Respiratory: positive for dyspnea on exertion   PHYSICAL EXAMINATION: General appearance: alert, cooperative and no distress Head: Normocephalic, without obvious abnormality, atraumatic Lymph nodes: Cervical, supraclavicular, and axillary nodes normal. Resp: clear to auscultation bilaterally Cardio: regular rate and rhythm, S1, S2 normal, no  murmur, click, rub or gallop GI: soft, non-tender; bowel sounds normal; no masses,  no organomegaly Extremities: extremities normal, atraumatic, no cyanosis or edema Neurologic: Alert and oriented X 3, normal strength and tone. Normal symmetric reflexes. Normal coordination and gait  ECOG PERFORMANCE STATUS: 1 - Symptomatic but completely ambulatory  Blood pressure 132/76, pulse 89, temperature 96.9 F (36.1 C), temperature source Oral, resp. rate 20, height 4\' 11"  (1.499 m), weight 236 lb 11.2 oz (107.366 kg).  LABORATORY DATA: Lab Results  Component Value Date   WBC 7.1 11/10/2011   HGB 11.3* 11/10/2011   HCT 34.1*  11/10/2011   MCV 94.7 11/10/2011   PLT 203 11/10/2011      Chemistry      Component Value Date/Time   NA 142 11/10/2011 1339   NA 143 05/02/2011 1412   NA 139 10/19/2010 1501   K 4.2 11/10/2011 1339   K 4.3 05/02/2011 1412   K 4.2 10/19/2010 1501   CL 99 11/10/2011 1339   CL 95* 05/02/2011 1412   CL 94* 10/19/2010 1501   CO2 31* 11/10/2011 1339   CO2 37* 05/02/2011 1412   CO2 29 10/19/2010 1501   BUN 23.0 11/10/2011 1339   BUN 21 05/02/2011 1412   BUN 25* 10/19/2010 1501   CREATININE 0.9 11/10/2011 1339   CREATININE 1.02 05/02/2011 1412   CREATININE 0.8 10/19/2010 1501      Component Value Date/Time   CALCIUM 10.1 11/10/2011 1339   CALCIUM 10.7* 05/02/2011 1412   CALCIUM 10.0 10/19/2010 1501   ALKPHOS 70 11/10/2011 1339   ALKPHOS 53 05/02/2011 1412   ALKPHOS 55 10/19/2010 1501   AST 11 11/10/2011 1339   AST 14 05/02/2011 1412   AST 17 10/19/2010 1501   ALT 11 11/10/2011 1339   ALT 11 05/02/2011 1412   BILITOT 0.60 11/10/2011 1339   BILITOT 0.5 05/02/2011 1412   BILITOT 0.50 10/19/2010 1501       RADIOGRAPHIC STUDIES: Ct Chest W Contrast  11/11/2011  *RADIOLOGY REPORT*  Clinical Data: Follow up of bilateral lung cancer.  Chemotherapy and radiation therapy completed in 2010.  Shortness of breath. Small cell lung cancer.  CT CHEST WITH CONTRAST  Technique:  Multidetector CT imaging of the chest was performed following the standard protocol during bolus administration of intravenous contrast.  Contrast: 80mL OMNIPAQUE IOHEXOL 300 MG/ML  SOLN  Comparison: 05/03/2011 and 10/20/2010.  Findings: Lung windows demonstrate surgical changes of left upper lobectomy.  Mild centrilobular emphysema. Mild volume loss in the inferior right middle lobe.  Improvement to resolution in the bronchial wall thickening and micro nodularity within the medial right upper lobe and superior segment right lower lobe.  Soft tissue windows demonstrate no supraclavicular adenopathy. Numerous small low density thyroid nodules, as well as  a right- sided thyroid calcification, unchanged.  Tortuous descending thoracic aorta.  Mild cardiomegaly with moderate lipomatous hypertrophy of the interatrial septum. Multivessel coronary artery atherosclerosis.  No central pulmonary embolism, on this non- dedicated study.  No mediastinal or hilar adenopathy.  Limited abdominal imaging demonstrates  probable hepatomegaly with mild hepatic steatosis.  No focal liver abnormality.  Partially fatty replaced pancreas.  Mild renal cortical thinning. Similar left adrenal mass at 3.9 x 2.8 cm; present back to 04/17/2008.  Incompletely imaged abdominal aortic aneurysm.  Moderate osteopenia.  IMPRESSION:  1.  No evidence of recurrent or metastatic disease within the chest/upper abdomen. 2.  Improved to resolved post infectious or inflammatory findings within the medial right upper lobe  and adjacent superior segment right lower lobe. 3. Chronic left adrenal lesion, likely an adenoma.   Original Report Authenticated By: Consuello Bossier, M.D.     ASSESSMENT: This is a very pleasant 78 years old white female with limited stage small cell lung cancer status post systemic chemotherapy completed in February of 2010 with no evidence for disease recurrence since that time.  PLAN: I discussed the scan results with the patient and her husband today. I recommended for her to continue on observation. She would have repeat CT scan of the chest in one year and she would come back for followup visit at the time. She was advised to call me immediately if she has any concerning symptoms in the interval.  All questions were answered. The patient knows to call the clinic with any problems, questions or concerns. We can certainly see the patient much sooner if necessary.

## 2012-01-10 ENCOUNTER — Telehealth: Payer: Self-pay | Admitting: *Deleted

## 2012-01-10 NOTE — Telephone Encounter (Signed)
Office note from Dr Cindee Lame given to Dr Donnald Garre to review.  SLJ

## 2012-04-12 ENCOUNTER — Telehealth: Payer: Self-pay | Admitting: *Deleted

## 2012-04-12 NOTE — Telephone Encounter (Signed)
office note Dr Cindee Lame 04/10/12 given to Dr Donnald Garre to review.  SLJ

## 2012-11-08 ENCOUNTER — Telehealth: Payer: Self-pay | Admitting: Medical Oncology

## 2012-11-08 NOTE — Telephone Encounter (Signed)
Confirmed f/u appt 

## 2012-11-12 ENCOUNTER — Encounter (HOSPITAL_COMMUNITY): Payer: Self-pay

## 2012-11-12 ENCOUNTER — Other Ambulatory Visit (HOSPITAL_BASED_OUTPATIENT_CLINIC_OR_DEPARTMENT_OTHER): Payer: Medicare Other | Admitting: Lab

## 2012-11-12 ENCOUNTER — Ambulatory Visit (HOSPITAL_COMMUNITY)
Admission: RE | Admit: 2012-11-12 | Discharge: 2012-11-12 | Disposition: A | Discharge status: Home / Self Care | Payer: Medicare Other | Source: Ambulatory Visit | Attending: Internal Medicine | Admitting: Internal Medicine

## 2012-11-12 DIAGNOSIS — E279 Disorder of adrenal gland, unspecified: Secondary | ICD-10-CM | POA: Insufficient documentation

## 2012-11-12 DIAGNOSIS — C3490 Malignant neoplasm of unspecified part of unspecified bronchus or lung: Secondary | ICD-10-CM | POA: Insufficient documentation

## 2012-11-12 DIAGNOSIS — I700 Atherosclerosis of aorta: Secondary | ICD-10-CM | POA: Insufficient documentation

## 2012-11-12 DIAGNOSIS — I2510 Atherosclerotic heart disease of native coronary artery without angina pectoris: Secondary | ICD-10-CM | POA: Insufficient documentation

## 2012-11-12 LAB — CBC WITH DIFFERENTIAL/PLATELET
Eosinophils Absolute: 0.2 10*3/uL (ref 0.0–0.5)
HGB: 11.3 g/dL — ABNORMAL LOW (ref 11.6–15.9)
LYMPH%: 17 % (ref 14.0–49.7)
MONO#: 0.9 10*3/uL (ref 0.1–0.9)
NEUT#: 4.5 10*3/uL (ref 1.5–6.5)
Platelets: 204 10*3/uL (ref 145–400)
RBC: 3.74 10*6/uL (ref 3.70–5.45)
RDW: 15.7 % — ABNORMAL HIGH (ref 11.2–14.5)
WBC: 6.8 10*3/uL (ref 3.9–10.3)

## 2012-11-12 LAB — COMPREHENSIVE METABOLIC PANEL (CC13)
Albumin: 3.5 g/dL (ref 3.5–5.0)
CO2: 37 mEq/L — ABNORMAL HIGH (ref 22–29)
Glucose: 170 mg/dl — ABNORMAL HIGH (ref 70–140)
Potassium: 4.6 mEq/L (ref 3.5–5.1)
Sodium: 146 mEq/L — ABNORMAL HIGH (ref 136–145)
Total Protein: 7 g/dL (ref 6.4–8.3)

## 2012-11-14 ENCOUNTER — Telehealth: Payer: Self-pay | Admitting: Internal Medicine

## 2012-11-14 ENCOUNTER — Encounter: Payer: Self-pay | Admitting: Internal Medicine

## 2012-11-14 ENCOUNTER — Ambulatory Visit (HOSPITAL_BASED_OUTPATIENT_CLINIC_OR_DEPARTMENT_OTHER): Payer: Medicare Other | Admitting: Internal Medicine

## 2012-11-14 DIAGNOSIS — C3490 Malignant neoplasm of unspecified part of unspecified bronchus or lung: Secondary | ICD-10-CM

## 2012-11-14 NOTE — Patient Instructions (Signed)
Followup visit in one year with repeat CT scan of the chest. 

## 2012-11-14 NOTE — Progress Notes (Signed)
[facility] Telephone:([phone]   Fax:([phone]  OFFICE PROGRESS NOTE  Ralene Ok, MD [address] [place] [address]  PRINCIPAL DIAGNOSES:  1. Limited-stage small-cell lung cancer diagnosed in November 2009. 2. History of stage IA non-small cell lung cancer, squamous cell carcinoma diagnosed in April 2004.  PRIOR THERAPY:  1. Status post left upper lobectomy under the care of Dr. Edwyna Shell on January 05, 2003. 2. Status post 4 cycles of systemic chemotherapy with carboplatin and etoposide. Last dose was given April 02, 2008.  CURRENT THERAPY: Observation.  INTERVAL HISTORY: [name] 79 y.o. female returns to the clinic today for  annual followup visit accompanied her husband. The patient is feeling fine today except for the baseline shortness of breath. The patient denied having any significant chest pain, cough or hemoptysis. She denied having any weight loss or night sweats. She denied having any nausea or vomiting. She had repeat CT scan of the chest performed recently and she is here for evaluation and discussion of her scan results.  MEDICAL HISTORY: Past Medical History  Diagnosis Date  . Lung cancer     lung ca dx 2004/2009  . Diabetes mellitus   . Hypertension   . COPD (chronic obstructive pulmonary disease)   . Rheumatoid arthritis(714.0)   . Hyperparathyroidism   . Endometriosis     HX  . Hypercholesterolemia   . AAA (abdominal aortic aneurysm)     3.4cm  . Atherosclerotic vascular disease     ALLERGIES:  has No Known Allergies.  MEDICATIONS:  Current Outpatient Prescriptions  Medication Sig Dispense Refill  . amlodipine-atorvastatin (CADUET) 10-20 MG per tablet Take 1 tablet by mouth daily.        Marland Kitchen aspirin 81 MG tablet Take 81 mg by mouth daily.      . clotrimazole-betamethasone (LOTRISONE) cream       . dexlansoprazole (DEXILANT) 60 MG capsule Take 60 mg by mouth daily as needed.       . ergocalciferol  (VITAMIN D2) 50000 UNITS capsule Take 50,000 Units by mouth once a week.        . metformin (FORTAMET) 500 MG (OSM) 24 hr tablet Take 500 mg by mouth 3 (three) times daily with meals.       . metoprolol succinate (TOPROL-XL) 50 MG 24 hr tablet Take 50 mg by mouth daily. Take with or immediately following a meal.      . hydrochlorothiazide (MICROZIDE) 12.5 MG capsule Take 12.5 mg by mouth daily.         No current facility-administered medications for this visit.    SURGICAL HISTORY:  Past Surgical History  Procedure Laterality Date  . Cholecystectomy    . Appendectomy    . S/p left upper lobectomy  2004  . Parathroidectomy  2002  . Retinal detachment surgery  1993    REVIEW OF SYSTEMS:  A comprehensive review of systems was negative except for: Respiratory: positive for dyspnea on exertion   PHYSICAL EXAMINATION: General appearance: alert, cooperative and moderately obese Head: Normocephalic, without obvious abnormality, atraumatic Neck: no adenopathy, no JVD, supple, symmetrical, trachea midline and thyroid not enlarged, symmetric, no tenderness/mass/nodules Lymph nodes: Cervical, supraclavicular, and axillary nodes normal. Resp: wheezes bilaterally Cardio: regular rate and rhythm, S1, S2 normal, no murmur, click, rub or gallop GI: soft, non-tender; bowel sounds normal; no masses,  no organomegaly Extremities: extremities normal, atraumatic, no cyanosis or edema  ECOG PERFORMANCE STATUS: 2 - Symptomatic, <50% confined  to bed  There were no vitals taken for this visit.  LABORATORY DATA: Lab Results  Component Value Date   WBC 6.8 11/12/2012   HGB 11.3* 11/12/2012   HCT 34.8 11/12/2012   MCV 93.2 11/12/2012   PLT 204 11/12/2012      Chemistry      Component Value Date/Time   NA 146* 11/12/2012 0919   NA 143 05/02/2011 1412   NA 139 10/19/2010 1501   K 4.6 11/12/2012 0919   K 4.3 05/02/2011 1412   K 4.2 10/19/2010 1501   CL 99 11/10/2011 1339   CL 95* 05/02/2011 1412   CL 94*  10/19/2010 1501   CO2 37* 11/12/2012 0919   CO2 37* 05/02/2011 1412   CO2 29 10/19/2010 1501   BUN 17.2 11/12/2012 0919   BUN 21 05/02/2011 1412   BUN 25* 10/19/2010 1501   CREATININE 0.9 11/12/2012 0919   CREATININE 1.02 05/02/2011 1412   CREATININE 0.8 10/19/2010 1501      Component Value Date/Time   CALCIUM 10.7* 11/12/2012 0919   CALCIUM 10.7* 05/02/2011 1412   CALCIUM 10.0 10/19/2010 1501   ALKPHOS 60 11/12/2012 0919   ALKPHOS 53 05/02/2011 1412   ALKPHOS 55 10/19/2010 1501   AST 10 11/12/2012 0919   AST 14 05/02/2011 1412   AST 17 10/19/2010 1501   ALT 8 11/12/2012 0919   ALT 11 05/02/2011 1412   ALT 15 10/19/2010 1501   BILITOT 0.64 11/12/2012 0919   BILITOT 0.5 05/02/2011 1412   BILITOT 0.50 10/19/2010 1501       RADIOGRAPHIC STUDIES: Ct Chest Wo Contrast  11/12/2012   CLINICAL DATA:  Followup lung cancer  EXAM: CT CHEST WITHOUT CONTRAST  TECHNIQUE: Multidetector CT imaging of the chest was performed following the standard protocol without IV contrast.  COMPARISON:  11/11/2011  FINDINGS: No pleural effusion. Moderate changes of centrilobular emphysema identified. Paramediastinal radiation change is identified within the right lung and appears slightly progressive from previous exam. There is progressive airspace consolidation within the anterior right middle lobe, image 49/ series 603.  No focal pulmonary parenchymal nodule or mass identified. The trachea appears patent and is midline. The heart size appears within normal limits. No pericardial effusion. Calcifications involving the thoracic aorta, left circumflex and RCA CORONARY ARTERIES noted. There is a partially calcified precarinal lymph node. Limited imaging through the upper abdomen shows a large nodule within the left adrenal gland. This measures 4 cm and is unchanged from previous exam, image 50/series 2. There is a stable low attenuation structure arising from the tail of pancreas measuring 1.8 cm. This measures -11 Hounsfield units and  may represent an area of focal fatty replacement.  No enlarged axillary or supraclavicular lymph nodes. Review of the visualized bony structures is significant for mild spondylosis. No aggressive lytic or sclerotic bone lesions identified.  IMPRESSION: 1. No specific features identified to suggest recurrent or metastatic disease within the chest or upper abdomen.  2. New patchy airspace densities are identified within the anterior right middle lobe. This is favored to represent sequelae of infection or inflammation.  3. Stable left adrenal lesion, likely an adenoma.   Electronically Signed   By: Signa Kell M.D.   On: 11/12/2012 10:56    ASSESSMENT AND PLAN: This is a very pleasant 79 years old white female with history of limited stage small cell lung cancer diagnosed in November of 2009 status post 4 cycles of systemic chemotherapy completed in February 2010 and has  been observation since that time with no evidence for disease recurrence. I discussed the scan results with the patient and her husband.  I recommended for her to continue on observation with repeat CT scan of the chest without contrast in one year. She was advised to call immediately if she has any concerning symptoms in the interval. The patient voices understanding of current disease status and treatment options and is in agreement with the current care plan.  All questions were answered. The patient knows to call the clinic with any problems, questions or concerns. We can certainly see the patient much sooner if necessary.

## 2012-11-14 NOTE — Telephone Encounter (Signed)
Gave pt appt for lab and MD on October 2015 °

## 2013-08-01 ENCOUNTER — Encounter (HOSPITAL_COMMUNITY): Payer: Self-pay | Admitting: Emergency Medicine

## 2013-08-01 ENCOUNTER — Emergency Department (HOSPITAL_COMMUNITY)
Admission: EM | Admit: 2013-08-01 | Discharge: 2013-08-01 | Disposition: A | Discharge status: Home / Self Care | Payer: Medicare Other | Attending: Emergency Medicine | Admitting: Emergency Medicine

## 2013-08-01 DIAGNOSIS — Z8742 Personal history of other diseases of the female genital tract: Secondary | ICD-10-CM | POA: Insufficient documentation

## 2013-08-01 DIAGNOSIS — E785 Hyperlipidemia, unspecified: Secondary | ICD-10-CM | POA: Insufficient documentation

## 2013-08-01 DIAGNOSIS — J449 Chronic obstructive pulmonary disease, unspecified: Secondary | ICD-10-CM | POA: Insufficient documentation

## 2013-08-01 DIAGNOSIS — Z85118 Personal history of other malignant neoplasm of bronchus and lung: Secondary | ICD-10-CM | POA: Insufficient documentation

## 2013-08-01 DIAGNOSIS — Z79899 Other long term (current) drug therapy: Secondary | ICD-10-CM | POA: Insufficient documentation

## 2013-08-01 DIAGNOSIS — Z8639 Personal history of other endocrine, nutritional and metabolic disease: Secondary | ICD-10-CM | POA: Insufficient documentation

## 2013-08-01 DIAGNOSIS — M069 Rheumatoid arthritis, unspecified: Secondary | ICD-10-CM | POA: Insufficient documentation

## 2013-08-01 DIAGNOSIS — E119 Type 2 diabetes mellitus without complications: Secondary | ICD-10-CM | POA: Insufficient documentation

## 2013-08-01 DIAGNOSIS — J4489 Other specified chronic obstructive pulmonary disease: Secondary | ICD-10-CM | POA: Insufficient documentation

## 2013-08-01 DIAGNOSIS — Z862 Personal history of diseases of the blood and blood-forming organs and certain disorders involving the immune mechanism: Secondary | ICD-10-CM | POA: Insufficient documentation

## 2013-08-01 DIAGNOSIS — Z87891 Personal history of nicotine dependence: Secondary | ICD-10-CM | POA: Insufficient documentation

## 2013-08-01 DIAGNOSIS — R1031 Right lower quadrant pain: Secondary | ICD-10-CM | POA: Insufficient documentation

## 2013-08-01 DIAGNOSIS — Z7982 Long term (current) use of aspirin: Secondary | ICD-10-CM | POA: Insufficient documentation

## 2013-08-01 DIAGNOSIS — I10 Essential (primary) hypertension: Secondary | ICD-10-CM | POA: Insufficient documentation

## 2013-08-01 DIAGNOSIS — R1030 Lower abdominal pain, unspecified: Secondary | ICD-10-CM

## 2013-08-01 LAB — CBG MONITORING, ED: Glucose-Capillary: 157 mg/dL — ABNORMAL HIGH (ref 70–99)

## 2013-08-01 MED ORDER — ONDANSETRON 4 MG PO TBDP: ORAL_TABLET | ORAL | Status: AC

## 2013-08-01 NOTE — Discharge Instructions (Signed)
Medication for nausea. Eat lightly tonight.  Check your blood sugar.  Followup your primary care Dr.

## 2013-08-01 NOTE — ED Notes (Signed)
Bed: NO70 Expected date:  Expected time:  Means of arrival:  Comments: EMS-abdominal pain

## 2013-08-01 NOTE — ED Notes (Signed)
Pt from home, reports intermittent lower abd pain since this morning when she had a bowel movement. Denies pain at present. Pt states BM was soft, no blood, no n/v/d. Reported dizziness to EMS, but none now.

## 2013-08-11 NOTE — ED Provider Notes (Addendum)
CSN: [id number]     Arrival date & time 08/01/13  1213 History   First MD Initiated Contact with Patient 08/01/13 1230     Chief Complaint  Patient presents with  . Abdominal Pain  . Dizziness     (Consider location/radiation/quality/duration/timing/severity/associated sxs/prior Treatment) HPI patient complains of vague lower abdominal pain worse in right lower quadrant since this morning associated with a bowel movement. No rectal bleeding. No pain at present. No fever, chills, dysuria, vomiting, diarrhea. Some dizziness earlier but not now.  Severity is mild. No radiation of pain.  Past Medical History  Diagnosis Date  . Lung cancer     lung ca dx 2004/2009  . Diabetes mellitus   . Hypertension   . COPD (chronic obstructive pulmonary disease)   . Rheumatoid arthritis(714.0)   . Hyperparathyroidism   . Endometriosis     HX  . Hypercholesterolemia   . AAA (abdominal aortic aneurysm)     3.4cm  . Atherosclerotic vascular disease    Past Surgical History  Procedure Laterality Date  . Cholecystectomy    . Appendectomy    . S/p left upper lobectomy  2004  . Parathroidectomy  2002  . Retinal detachment surgery  1993   Family History  Problem Relation Age of Onset  . Kidney disease Mother   . Heart disease Mother    History  Substance Use Topics  . Smoking status: Former Smoker    Types: Cigarettes    Quit date: 02/07/2001  . Smokeless tobacco: Never Used  . Alcohol Use: No   OB History   Grav Para Term Preterm Abortions TAB SAB Ect Mult Living                 Review of Systems  All other systems reviewed and are negative.     Allergies  Review of patient's allergies indicates no known allergies.  Home Medications   Prior to Admission medications   Medication Sig Start Date End Date Taking? Authorizing Provider  albuterol (PROVENTIL HFA;VENTOLIN HFA) 108 (90 BASE) MCG/ACT inhaler Inhale 2 puffs into the lungs every 6 (six) hours as needed for wheezing or  shortness of breath.   Yes Historical Provider, MD  amlodipine-atorvastatin (CADUET) 10-20 MG per tablet Take 1 tablet by mouth every morning.    Yes Historical Provider, MD  aspirin 81 MG tablet Take 81 mg by mouth every morning.    Yes Historical Provider, MD  ergocalciferol (VITAMIN D2) 50000 UNITS capsule Take 50,000 Units by mouth every Thursday.    Yes Historical Provider, MD  metFORMIN (GLUCOPHAGE) 500 MG tablet Take 500 mg by mouth 2 (two) times daily with a meal.   Yes Historical Provider, MD  metoprolol (LOPRESSOR) 50 MG tablet Take 50 mg by mouth every morning.   Yes Historical Provider, MD  ondansetron (ZOFRAN ODT) 4 MG disintegrating tablet 4mg  ODT q4 hours prn nausea/vomit 08/01/13   Nat Christen, MD   BP 123/57  Pulse 111  Temp(Src) 97.8 F (36.6 C) (Oral)  Resp 20  SpO2 96% Physical Exam  Nursing note and vitals reviewed. Constitutional: She is oriented to person, place, and time. She appears well-developed and well-nourished.  HENT:  Head: Normocephalic and atraumatic.  Eyes: Conjunctivae and EOM are normal. Pupils are equal, round, and reactive to light.  Neck: Normal range of motion. Neck supple.  Cardiovascular: Normal rate, regular rhythm and normal heart sounds.   Pulmonary/Chest: Effort normal and breath sounds normal.  Abdominal: Soft. Bowel sounds are  normal.  Musculoskeletal: Normal range of motion.  Neurological: She is alert and oriented to person, place, and time.  Skin: Skin is warm and dry.  Psychiatric: She has a normal mood and affect. Her behavior is normal.    ED Course  Procedures (including critical care time) Labs Review Labs Reviewed  CBG MONITORING, ED - Abnormal; Notable for the following:    Glucose-Capillary 157 (*)    All other components within normal limits    Imaging Review No results found.   EKG Interpretation   Date/Time:  Thursday August 01 2013 12:20:30 EDT Ventricular Rate:  99 PR Interval:  181 QRS Duration: 108 QT  Interval:  360 QTC Calculation: 462 R Axis:   -24 Text Interpretation:  Sinus rhythm Borderline left axis deviation Low  voltage, precordial leads ED PHYSICIAN INTERPRETATION AVAILABLE IN [facility] Confirmed by TEST, Record ([id number]) on 08/03/2013 8:44:11 AM      MDM   Final diagnoses:  Lower abdominal pain    Patient is symptom-free at this time. No acute abdomen. Glucose 157. EKG shows normal sinus rhythm at 99 beats per minute. She has primary care follow up    Nat Christen, MD 08/11/13 2054  Nat Christen, MD 08/11/13 [phone]

## 2013-11-12 ENCOUNTER — Other Ambulatory Visit (HOSPITAL_BASED_OUTPATIENT_CLINIC_OR_DEPARTMENT_OTHER): Payer: Medicare Other

## 2013-11-12 ENCOUNTER — Encounter (HOSPITAL_COMMUNITY): Payer: Self-pay

## 2013-11-12 ENCOUNTER — Ambulatory Visit (HOSPITAL_COMMUNITY)
Admission: RE | Admit: 2013-11-12 | Discharge: 2013-11-12 | Disposition: A | Discharge status: Home / Self Care | Payer: Medicare Other | Source: Ambulatory Visit | Attending: Internal Medicine | Admitting: Internal Medicine

## 2013-11-12 DIAGNOSIS — R0602 Shortness of breath: Secondary | ICD-10-CM | POA: Insufficient documentation

## 2013-11-12 DIAGNOSIS — I700 Atherosclerosis of aorta: Secondary | ICD-10-CM | POA: Insufficient documentation

## 2013-11-12 DIAGNOSIS — E041 Nontoxic single thyroid nodule: Secondary | ICD-10-CM | POA: Insufficient documentation

## 2013-11-12 DIAGNOSIS — R05 Cough: Secondary | ICD-10-CM | POA: Diagnosis present

## 2013-11-12 DIAGNOSIS — C3490 Malignant neoplasm of unspecified part of unspecified bronchus or lung: Secondary | ICD-10-CM

## 2013-11-12 DIAGNOSIS — C3412 Malignant neoplasm of upper lobe, left bronchus or lung: Secondary | ICD-10-CM | POA: Insufficient documentation

## 2013-11-12 DIAGNOSIS — R079 Chest pain, unspecified: Secondary | ICD-10-CM | POA: Insufficient documentation

## 2013-11-12 DIAGNOSIS — I77811 Abdominal aortic ectasia: Secondary | ICD-10-CM | POA: Diagnosis not present

## 2013-11-12 DIAGNOSIS — Z902 Acquired absence of lung [part of]: Secondary | ICD-10-CM | POA: Diagnosis not present

## 2013-11-12 DIAGNOSIS — M4854XA Collapsed vertebra, not elsewhere classified, thoracic region, initial encounter for fracture: Secondary | ICD-10-CM | POA: Diagnosis not present

## 2013-11-12 DIAGNOSIS — Z85118 Personal history of other malignant neoplasm of bronchus and lung: Secondary | ICD-10-CM

## 2013-11-12 DIAGNOSIS — R1909 Other intra-abdominal and pelvic swelling, mass and lump: Secondary | ICD-10-CM | POA: Insufficient documentation

## 2013-11-12 DIAGNOSIS — K863 Pseudocyst of pancreas: Secondary | ICD-10-CM | POA: Diagnosis not present

## 2013-11-12 DIAGNOSIS — Z87891 Personal history of nicotine dependence: Secondary | ICD-10-CM | POA: Insufficient documentation

## 2013-11-12 DIAGNOSIS — Z9049 Acquired absence of other specified parts of digestive tract: Secondary | ICD-10-CM | POA: Diagnosis not present

## 2013-11-12 DIAGNOSIS — J432 Centrilobular emphysema: Secondary | ICD-10-CM | POA: Insufficient documentation

## 2013-11-12 DIAGNOSIS — J8410 Pulmonary fibrosis, unspecified: Secondary | ICD-10-CM | POA: Insufficient documentation

## 2013-11-12 LAB — CBC WITH DIFFERENTIAL/PLATELET
BASO%: 0.8 % (ref 0.0–2.0)
BASOS ABS: 0 10*3/uL (ref 0.0–0.1)
EOS ABS: 0.2 10*3/uL (ref 0.0–0.5)
EOS%: 3.4 % (ref 0.0–7.0)
HEMATOCRIT: 34 % — AB (ref 34.8–46.6)
HEMOGLOBIN: 10.8 g/dL — AB (ref 11.6–15.9)
LYMPH%: 20.4 % (ref 14.0–49.7)
MCH: 29.8 pg (ref 25.1–34.0)
MCHC: 31.7 g/dL (ref 31.5–36.0)
MCV: 93.7 fL (ref 79.5–101.0)
MONO#: 0.8 10*3/uL (ref 0.1–0.9)
MONO%: 14.7 % — AB (ref 0.0–14.0)
NEUT%: 60.7 % (ref 38.4–76.8)
NEUTROS ABS: 3.4 10*3/uL (ref 1.5–6.5)
PLATELETS: 185 10*3/uL (ref 145–400)
RBC: 3.63 10*6/uL — ABNORMAL LOW (ref 3.70–5.45)
RDW: 15.3 % — ABNORMAL HIGH (ref 11.2–14.5)
WBC: 5.6 10*3/uL (ref 3.9–10.3)
lymph#: 1.1 10*3/uL (ref 0.9–3.3)

## 2013-11-12 LAB — COMPREHENSIVE METABOLIC PANEL (CC13)
ALBUMIN: 3.6 g/dL (ref 3.5–5.0)
ALK PHOS: 61 U/L (ref 40–150)
ALT: 12 U/L (ref 0–55)
AST: 12 U/L (ref 5–34)
Anion Gap: 7 mEq/L (ref 3–11)
BUN: 18 mg/dL (ref 7.0–26.0)
CALCIUM: 10.7 mg/dL — AB (ref 8.4–10.4)
CO2: 37 mEq/L — ABNORMAL HIGH (ref 22–29)
CREATININE: 0.8 mg/dL (ref 0.6–1.1)
Chloride: 98 mEq/L (ref 98–109)
GLUCOSE: 146 mg/dL — AB (ref 70–140)
POTASSIUM: 4.4 meq/L (ref 3.5–5.1)
Sodium: 143 mEq/L (ref 136–145)
Total Bilirubin: 0.47 mg/dL (ref 0.20–1.20)
Total Protein: 7.1 g/dL (ref 6.4–8.3)

## 2013-11-13 ENCOUNTER — Encounter: Payer: Self-pay | Admitting: Internal Medicine

## 2013-11-13 ENCOUNTER — Telehealth: Payer: Self-pay | Admitting: Internal Medicine

## 2013-11-13 ENCOUNTER — Ambulatory Visit (HOSPITAL_BASED_OUTPATIENT_CLINIC_OR_DEPARTMENT_OTHER): Payer: Medicare Other | Admitting: Internal Medicine

## 2013-11-13 VITALS — BP 139/51 | HR 69 | Temp 97.7°F | Resp 16 | Ht 59.0 in | Wt 219.2 lb

## 2013-11-13 DIAGNOSIS — C3490 Malignant neoplasm of unspecified part of unspecified bronchus or lung: Secondary | ICD-10-CM | POA: Insufficient documentation

## 2013-11-13 DIAGNOSIS — Z85118 Personal history of other malignant neoplasm of bronchus and lung: Secondary | ICD-10-CM

## 2013-11-13 DIAGNOSIS — C3412 Malignant neoplasm of upper lobe, left bronchus or lung: Secondary | ICD-10-CM

## 2013-11-13 NOTE — Progress Notes (Signed)
[facility] Telephone:(336) [phone]   Fax:(336) [phone]  OFFICE PROGRESS NOTE  Jilda Panda, MD [address] [place] [address]  PRINCIPAL DIAGNOSES:  1. Limited-stage small-cell lung cancer diagnosed in November 2009. 2. History of stage IA non-small cell lung cancer, squamous cell carcinoma diagnosed in April 2004.  PRIOR THERAPY:  1. Status post left upper lobectomy under the care of Dr. Arlyce Dice on January 05, 2003. 2. Status post 4 cycles of systemic chemotherapy with carboplatin and etoposide. Last dose was given April 02, 2008.  CURRENT THERAPY: Observation.  INTERVAL HISTORY: [name] 80 y.o. female returns to the clinic today for  annual followup visit accompanied her husband. The patient is feeling fine today except for the baseline shortness of breath. She is currently on home oxygen 3 L per minute.The patient denied having any significant chest pain, cough or hemoptysis. She denied having any weight loss or night sweats. She denied having any nausea or vomiting. She had repeat CT scan of the chest performed recently and she is here for evaluation and discussion of her scan results.  MEDICAL HISTORY: Past Medical History  Diagnosis Date  . Lung cancer     lung ca dx 2004/2009  . Diabetes mellitus   . Hypertension   . COPD (chronic obstructive pulmonary disease)   . Rheumatoid arthritis(714.0)   . Hyperparathyroidism   . Endometriosis     HX  . Hypercholesterolemia   . AAA (abdominal aortic aneurysm)     3.4cm  . Atherosclerotic vascular disease     ALLERGIES:  has No Known Allergies.  MEDICATIONS:  Current Outpatient Prescriptions  Medication Sig Dispense Refill  . albuterol (PROVENTIL HFA;VENTOLIN HFA) 108 (90 BASE) MCG/ACT inhaler Inhale 2 puffs into the lungs every 6 (six) hours as needed for wheezing or shortness of breath.      Marland Kitchen amlodipine-atorvastatin (CADUET) 10-20 MG per tablet Take 1 tablet by mouth every  morning.       Marland Kitchen aspirin 81 MG tablet Take 81 mg by mouth every morning.       . ergocalciferol (VITAMIN D2) 50000 UNITS capsule Take 50,000 Units by mouth every Thursday.       . metFORMIN (GLUCOPHAGE) 500 MG tablet Take 500 mg by mouth 2 (two) times daily with a meal.      . metoprolol (LOPRESSOR) 50 MG tablet Take 50 mg by mouth every morning.      . ondansetron (ZOFRAN ODT) 4 MG disintegrating tablet 4mg  ODT q4 hours prn nausea/vomit  15 tablet  0   No current facility-administered medications for this visit.    SURGICAL HISTORY:  Past Surgical History  Procedure Laterality Date  . Cholecystectomy    . Appendectomy    . S/p left upper lobectomy  2004  . Parathroidectomy  2002  . Retinal detachment surgery  1993    REVIEW OF SYSTEMS:  A comprehensive review of systems was negative except for: Respiratory: positive for dyspnea on exertion   PHYSICAL EXAMINATION: General appearance: alert, cooperative and moderately obese Head: Normocephalic, without obvious abnormality, atraumatic Neck: no adenopathy, no JVD, supple, symmetrical, trachea midline and thyroid not enlarged, symmetric, no tenderness/mass/nodules Lymph nodes: Cervical, supraclavicular, and axillary nodes normal. Resp: wheezes bilaterally Cardio: regular rate and rhythm, S1, S2 normal, no murmur, click, rub or gallop GI: soft, non-tender; bowel sounds normal; no masses,  no organomegaly Extremities: extremities normal, atraumatic, no cyanosis or edema  ECOG PERFORMANCE STATUS: 2 - Symptomatic, <50%  confined to bed  Blood pressure 139/51, pulse 69, temperature 97.7 F (36.5 C), temperature source Oral, resp. rate 16, height 4\' 11"  (1.499 m), weight 219 lb 3.2 oz (99.428 kg), SpO2 86.00%.  LABORATORY DATA: Lab Results  Component Value Date   WBC 5.6 11/12/2013   HGB 10.8* 11/12/2013   HCT 34.0* 11/12/2013   MCV 93.7 11/12/2013   PLT 185 11/12/2013      Chemistry      Component Value Date/Time   NA 143 11/12/2013  0905   NA 143 05/02/2011 1412   NA 139 10/19/2010 1501   K 4.4 11/12/2013 0905   K 4.3 05/02/2011 1412   K 4.2 10/19/2010 1501   CL 99 11/10/2011 1339   CL 95* 05/02/2011 1412   CL 94* 10/19/2010 1501   CO2 37* 11/12/2013 0905   CO2 37* 05/02/2011 1412   CO2 29 10/19/2010 1501   BUN 18.0 11/12/2013 0905   BUN 21 05/02/2011 1412   BUN 25* 10/19/2010 1501   CREATININE 0.8 11/12/2013 0905   CREATININE 1.02 05/02/2011 1412   CREATININE 0.8 10/19/2010 1501      Component Value Date/Time   CALCIUM 10.7* 11/12/2013 0905   CALCIUM 10.7* 05/02/2011 1412   CALCIUM 10.0 10/19/2010 1501   ALKPHOS 61 11/12/2013 0905   ALKPHOS 53 05/02/2011 1412   ALKPHOS 55 10/19/2010 1501   AST 12 11/12/2013 0905   AST 14 05/02/2011 1412   AST 17 10/19/2010 1501   ALT 12 11/12/2013 0905   ALT 11 05/02/2011 1412   ALT 15 10/19/2010 1501   BILITOT 0.47 11/12/2013 0905   BILITOT 0.5 05/02/2011 1412   BILITOT 0.50 10/19/2010 1501       RADIOGRAPHIC STUDIES: Ct Chest Wo Contrast  11/12/2013   CLINICAL DATA:  Rt. lung ca dx'd 2009, chemo and xrt complete, lt. Lung ca dx'd , lt. Upper lobectomy, chest pain, cough, shortness of breath. Subsequent encounter.  EXAM: CT CHEST WITHOUT CONTRAST  TECHNIQUE: Multidetector CT imaging of the chest was performed following the standard protocol without IV contrast.  COMPARISON:  11/12/2012.  Clinic note of 11/14/2012.  FINDINGS: Lungs/Pleura:  Status post left upper lobectomy.  Moderate centrilobular emphysema.  Volume loss within the anterior left lung base. Right lower lobe calcified granuloma.  Subpleural opacity at the anterior right lung base is improved. Example image 37 today.  No pleural fluid.  Heart/Mediastinum: Mild thyroid enlargement with calcified right-sided nodule, unchanged. Dense aortic and branch vessel atherosclerosis. Mild cardiomegaly. Lipomatous hypertrophy of the interatrial septum. No pericardial effusion. No mediastinal or definite hilar adenopathy, given limitations of  unenhanced CT.  Upper Abdomen: Cholecystectomy. Normal imaged portions of the liver, spleen, stomach, right adrenal gland. Bilateral renal cortical thinning. Similar left adrenal nodule 3.6 x 2.7 cm. This has been present back to 2011, most consistent with a benign entity. Low-density focus in the dorsal pancreatic body measures 1.8 cm on image 50. This has also been present back to 2011, favoring a pseudocyst. Abdominal aortic and branch vessel atherosclerosis with dilatation of the aorta at the level of the diaphragm. Example 4.6 cm today versus 4.2 cm in 2011.  Bones/Musculoskeletal: Moderate compression deformity involving the T6 vertebral body. This is new since 11/12/2012. No underlying metastasis identified. No ventral canal encroachment.  IMPRESSION: 1. No evidence of recurrent or metastatic disease within the chest. 2. Improved anterior right lung base opacification. Favor the sequelae of prior infection or inflammation. 3. Interval compression deformity of the T6 vertebral body.  Favored to be due to osteopenia. 4. Stable left adrenal lesion, likely a benign adenoma. 5. Chronic pancreatic body lesion which is likely a pseudocyst and has been grossly similar back to 2011. 6. Atherosclerosis with ectasia of the upper abdominal aorta. Grossly similar to the prior exam but slightly increased since 2011.   Electronically Signed   By: Abigail Miyamoto M.D.   On: 11/12/2013 10:37   ASSESSMENT AND PLAN: This is a very pleasant 80 years old white female with history of limited stage small cell lung cancer diagnosed in November of 2009 status post 4 cycles of systemic chemotherapy completed in February 2010 and has been observation since that time with no evidence for disease recurrence. I discussed the scan results with the patient and her husband.  She will continue on observation with repeat CT scan of the chest without contrast in one year. She was advised to call immediately if she has any concerning symptoms in  the interval. The patient voices understanding of current disease status and treatment options and is in agreement with the current care plan.  All questions were answered. The patient knows to call the clinic with any problems, questions or concerns. We can certainly see the patient much sooner if necessary.  Disclaimer: This note was dictated with voice recognition software. Similar sounding words can inadvertently be transcribed and may be missed upon review.

## 2013-11-13 NOTE — Telephone Encounter (Signed)
gv and printed appt sched and avs for pt for OCT 2016 °

## 2014-11-10 ENCOUNTER — Ambulatory Visit (HOSPITAL_COMMUNITY)
Admission: RE | Admit: 2014-11-10 | Discharge: 2014-11-10 | Disposition: A | Discharge status: Home / Self Care | Payer: Medicare Other | Source: Ambulatory Visit | Attending: Internal Medicine | Admitting: Internal Medicine

## 2014-11-10 ENCOUNTER — Encounter (HOSPITAL_COMMUNITY): Payer: Self-pay

## 2014-11-10 ENCOUNTER — Other Ambulatory Visit (HOSPITAL_BASED_OUTPATIENT_CLINIC_OR_DEPARTMENT_OTHER): Payer: Medicare Other

## 2014-11-10 DIAGNOSIS — R0602 Shortness of breath: Secondary | ICD-10-CM | POA: Diagnosis not present

## 2014-11-10 DIAGNOSIS — Z85118 Personal history of other malignant neoplasm of bronchus and lung: Secondary | ICD-10-CM | POA: Diagnosis not present

## 2014-11-10 DIAGNOSIS — K869 Disease of pancreas, unspecified: Secondary | ICD-10-CM | POA: Diagnosis not present

## 2014-11-10 DIAGNOSIS — Z923 Personal history of irradiation: Secondary | ICD-10-CM | POA: Insufficient documentation

## 2014-11-10 DIAGNOSIS — I2510 Atherosclerotic heart disease of native coronary artery without angina pectoris: Secondary | ICD-10-CM | POA: Diagnosis not present

## 2014-11-10 DIAGNOSIS — C3412 Malignant neoplasm of upper lobe, left bronchus or lung: Secondary | ICD-10-CM

## 2014-11-10 DIAGNOSIS — Z9221 Personal history of antineoplastic chemotherapy: Secondary | ICD-10-CM | POA: Diagnosis not present

## 2014-11-10 DIAGNOSIS — J439 Emphysema, unspecified: Secondary | ICD-10-CM | POA: Insufficient documentation

## 2014-11-10 DIAGNOSIS — Z87891 Personal history of nicotine dependence: Secondary | ICD-10-CM | POA: Diagnosis not present

## 2014-11-10 DIAGNOSIS — Z902 Acquired absence of lung [part of]: Secondary | ICD-10-CM | POA: Diagnosis not present

## 2014-11-10 DIAGNOSIS — E279 Disorder of adrenal gland, unspecified: Secondary | ICD-10-CM | POA: Insufficient documentation

## 2014-11-10 LAB — CBC WITH DIFFERENTIAL/PLATELET
BASO%: 0.5 % (ref 0.0–2.0)
BASOS ABS: 0 10*3/uL (ref 0.0–0.1)
EOS ABS: 0.1 10*3/uL (ref 0.0–0.5)
EOS%: 2 % (ref 0.0–7.0)
HEMATOCRIT: 37.6 % (ref 34.8–46.6)
HGB: 12.3 g/dL (ref 11.6–15.9)
LYMPH#: 1.1 10*3/uL (ref 0.9–3.3)
LYMPH%: 19 % (ref 14.0–49.7)
MCH: 28.9 pg (ref 25.1–34.0)
MCHC: 32.7 g/dL (ref 31.5–36.0)
MCV: 88.4 fL (ref 79.5–101.0)
MONO#: 0.8 10*3/uL (ref 0.1–0.9)
MONO%: 14.8 % — ABNORMAL HIGH (ref 0.0–14.0)
NEUT#: 3.6 10*3/uL (ref 1.5–6.5)
NEUT%: 63.7 % (ref 38.4–76.8)
PLATELETS: 187 10*3/uL (ref 145–400)
RBC: 4.26 10*6/uL (ref 3.70–5.45)
RDW: 14.3 % (ref 11.2–14.5)
WBC: 5.6 10*3/uL (ref 3.9–10.3)

## 2014-11-10 LAB — COMPREHENSIVE METABOLIC PANEL (CC13)
ALBUMIN: 3.8 g/dL (ref 3.5–5.0)
ALK PHOS: 57 U/L (ref 40–150)
ALT: 12 U/L (ref 0–55)
ANION GAP: 7 meq/L (ref 3–11)
AST: 12 U/L (ref 5–34)
BUN: 22.7 mg/dL (ref 7.0–26.0)
CALCIUM: 10.5 mg/dL — AB (ref 8.4–10.4)
CHLORIDE: 100 meq/L (ref 98–109)
CO2: 36 mEq/L — ABNORMAL HIGH (ref 22–29)
CREATININE: 0.9 mg/dL (ref 0.6–1.1)
EGFR: 57 mL/min/{1.73_m2} — ABNORMAL LOW (ref >90)
Glucose: 134 mg/dl (ref 70–140)
POTASSIUM: 4.2 meq/L (ref 3.5–5.1)
Sodium: 143 mEq/L (ref 136–145)
Total Bilirubin: 0.62 mg/dL (ref 0.20–1.20)
Total Protein: 7 g/dL (ref 6.4–8.3)

## 2014-11-17 ENCOUNTER — Ambulatory Visit: Payer: Medicare Other | Admitting: Internal Medicine

## 2014-11-17 ENCOUNTER — Telehealth: Payer: Self-pay | Admitting: Internal Medicine

## 2014-11-17 NOTE — Telephone Encounter (Signed)
Spouse called to r/s appointment. Gave spouse new appointment for 11/9.

## 2014-12-17 ENCOUNTER — Ambulatory Visit (HOSPITAL_BASED_OUTPATIENT_CLINIC_OR_DEPARTMENT_OTHER): Payer: Medicare Other | Admitting: Internal Medicine

## 2014-12-17 ENCOUNTER — Telehealth: Payer: Self-pay | Admitting: Internal Medicine

## 2014-12-17 ENCOUNTER — Encounter: Payer: Self-pay | Admitting: Internal Medicine

## 2014-12-17 VITALS — BP 123/63 | HR 84 | Temp 97.8°F | Resp 17 | Ht 59.0 in | Wt 239.6 lb

## 2014-12-17 DIAGNOSIS — Z85118 Personal history of other malignant neoplasm of bronchus and lung: Secondary | ICD-10-CM

## 2014-12-17 DIAGNOSIS — C3492 Malignant neoplasm of unspecified part of left bronchus or lung: Secondary | ICD-10-CM

## 2014-12-17 NOTE — Progress Notes (Signed)
[name]:([phone]   Fax:(336) [phone]  OFFICE PROGRESS NOTE  Jilda Panda, MD [address] [place] [address]  PRINCIPAL DIAGNOSES:  1. Limited-stage small-cell lung cancer diagnosed in November 2009. 2. History of stage IA non-small cell lung cancer, squamous cell carcinoma diagnosed in April 2004.  PRIOR THERAPY:  1. Status post left upper lobectomy under the care of Dr. Arlyce Dice on January 05, 2003. 2. Status post 4 cycles of systemic chemotherapy with carboplatin and etoposide. Last dose was given April 02, 2008.  CURRENT THERAPY: Observation.  INTERVAL HISTORY: [name] 81 y.o. female returns to the clinic today for  annual followup visit accompanied her husband. The patient is feeling fine today except for the baseline shortness of breath and she is a state on home oxygen 3 L per minute.The patient denied having any significant chest pain, cough or hemoptysis. She denied having any weight loss or night sweats. She denied having any nausea or vomiting. She had repeat CT scan of the chest performed recently and she is here for evaluation and discussion of her scan results.  MEDICAL HISTORY: Past Medical History  Diagnosis Date  . Lung cancer ([facility])     lung ca dx 2004/2009  . Diabetes mellitus   . Hypertension   . COPD (chronic obstructive pulmonary disease) ([facility])   . Rheumatoid arthritis(714.0)   . Hyperparathyroidism   . Endometriosis     HX  . Hypercholesterolemia   . AAA (abdominal aortic aneurysm) ([facility])     3.4cm  . Atherosclerotic vascular disease     ALLERGIES:  has No Known Allergies.  MEDICATIONS:  Current Outpatient Prescriptions  Medication Sig Dispense Refill  . albuterol (PROVENTIL HFA;VENTOLIN HFA) 108 (90 BASE) MCG/ACT inhaler Inhale 2 puffs into the lungs every 6 (six) hours as needed for wheezing or shortness of breath.    Marland Kitchen amlodipine-atorvastatin (CADUET) 10-20 MG per tablet Take 1 tablet by  mouth every morning.     Marland Kitchen aspirin 81 MG tablet Take 81 mg by mouth every morning.     . ergocalciferol (VITAMIN D2) 50000 UNITS capsule Take 50,000 Units by mouth every Thursday.     . metFORMIN (GLUCOPHAGE) 500 MG tablet Take 500 mg by mouth 2 (two) times daily with a meal.    . metoprolol (LOPRESSOR) 50 MG tablet Take 50 mg by mouth every morning.    . ondansetron (ZOFRAN ODT) 4 MG disintegrating tablet '4mg'$  ODT q4 hours prn nausea/vomit 15 tablet 0   No current facility-administered medications for this visit.    SURGICAL HISTORY:  Past Surgical History  Procedure Laterality Date  . Cholecystectomy    . Appendectomy    . S/p left upper lobectomy  2004  . Parathroidectomy  2002  . Retinal detachment surgery  1993    REVIEW OF SYSTEMS:  A comprehensive review of systems was negative except for: Respiratory: positive for dyspnea on exertion   PHYSICAL EXAMINATION: General appearance: alert, cooperative and moderately obese Head: Normocephalic, without obvious abnormality, atraumatic Neck: no adenopathy, no JVD, supple, symmetrical, trachea midline and thyroid not enlarged, symmetric, no tenderness/mass/nodules Lymph nodes: Cervical, supraclavicular, and axillary nodes normal. Resp: wheezes bilaterally Cardio: regular rate and rhythm, S1, S2 normal, no murmur, click, rub or gallop GI: soft, non-tender; bowel sounds normal; no masses,  no organomegaly Extremities: extremities normal, atraumatic, no cyanosis or edema  ECOG PERFORMANCE STATUS: 2 - Symptomatic, <50% confined to bed  Blood pressure 123/63, pulse 84,  temperature 97.8 F (36.6 C), temperature source Oral, resp. rate 17, height '4\' 11"'$  (1.499 m), weight 239 lb 9.6 oz (108.682 kg), SpO2 94 %.  LABORATORY DATA: Lab Results  Component Value Date   WBC 5.6 11/10/2014   HGB 12.3 11/10/2014   HCT 37.6 11/10/2014   MCV 88.4 11/10/2014   PLT 187 11/10/2014      Chemistry      Component Value Date/Time   NA 143  11/10/2014 0907   NA 143 05/02/2011 1412   NA 139 10/19/2010 1501   K 4.2 11/10/2014 0907   K 4.3 05/02/2011 1412   K 4.2 10/19/2010 1501   CL 99 11/10/2011 1339   CL 95* 05/02/2011 1412   CL 94* 10/19/2010 1501   CO2 36* 11/10/2014 0907   CO2 37* 05/02/2011 1412   CO2 29 10/19/2010 1501   BUN 22.7 11/10/2014 0907   BUN 21 05/02/2011 1412   BUN 25* 10/19/2010 1501   CREATININE 0.9 11/10/2014 0907   CREATININE 1.02 05/02/2011 1412   CREATININE 0.8 10/19/2010 1501      Component Value Date/Time   CALCIUM 10.5* 11/10/2014 0907   CALCIUM 10.7* 05/02/2011 1412   CALCIUM 10.0 10/19/2010 1501   ALKPHOS 57 11/10/2014 0907   ALKPHOS 53 05/02/2011 1412   ALKPHOS 55 10/19/2010 1501   AST 12 11/10/2014 0907   AST 14 05/02/2011 1412   AST 17 10/19/2010 1501   ALT 12 11/10/2014 0907   ALT 11 05/02/2011 1412   ALT 15 10/19/2010 1501   BILITOT 0.62 11/10/2014 0907   BILITOT 0.5 05/02/2011 1412   BILITOT 0.50 10/19/2010 1501       RADIOGRAPHIC STUDIES: Ct Chest Wo Contrast  11/10/2014  CLINICAL DATA:  Lung cancer diagnosed on the left side and 2004 and on the right side and 2009. Left lower lobectomy. Chemotherapy. Radiation therapy completed in 2010. Cough. Continuous shortness of breath. Prior history of smoking. COPD. EXAM: CT CHEST WITHOUT CONTRAST TECHNIQUE: Multidetector CT imaging of the chest was performed following the standard protocol without IV contrast. COMPARISON:  11/12/2013 FINDINGS: Mediastinum/Nodes: Mildly prominent left thyroid lobe, possible solid nodule 1.7 cm inferiorly in the left lobe, but not overtly changed back from 2009, and without hypermetabolic activity on interval PET-CT exams in this area. Coronary, aortic arch, and branch vessel atherosclerotic vascular disease. Lungs/Pleura: Emphysema. Pleural nodularity medially along the left upper lobe with adjacent scarring, not changed from prior. Right paramediastinal radiation fibrosis along the hilum and mediastinal  margin. There has been some progressive airspace opacity in the medial and anterior right lower lobe since 11/11/2011. Upper abdomen: Cholecystectomy. 4.2 by 2.8 cm left renal lesion. This lesion was biopsied in 2005 was not hypermetabolic by report on prior PET-CT. In indistinct 2.2 cm pancreatic body hypodensity on image 53 of series 2 has also not demonstrated hypermetabolic activity Musculoskeletal: Stable T6 compression fracture with some sclerosis in the vertebral body. IMPRESSION: 1. There has been slow progression of airspace opacity anteriorly and medially in the right middle lobe. This could be from prior radiation therapy, and has been only slowly progressive over the past 3 years. A currently does not have a masslike appearance, but merits observation. There is also more cephalad right paramediastinal and perihilar radiation fibrosis. 2. Essentially stable pancreatic and adrenal lesions, not previously hypermetabolic and chronically stable, likely benign. 3. Stable T6 compression fracture. 4. Emphysema. 5. Coronary, aortic arch, and branch vessel atherosclerotic vascular disease. Electronically Signed   By: Van Clines  M.D.   On: 11/10/2014 11:00   ASSESSMENT AND PLAN: This is a very pleasant 81 years old white female with history of limited stage small cell lung cancer diagnosed in November of 2009 status post 4 cycles of systemic chemotherapy completed in February 2010 and has been observation since that time with no evidence for disease recurrence except for some progressive radiation changes. I discussed the scan results with the patient and her husband.  She will continue on observation with repeat CT scan of the chest without contrast in one year. She was advised to call immediately if she has any concerning symptoms in the interval. The patient voices understanding of current disease status and treatment options and is in agreement with the current care plan.  All questions were  answered. The patient knows to call the clinic with any problems, questions or concerns. We can certainly see the patient much sooner if necessary.  Disclaimer: This note was dictated with voice recognition software. Similar sounding words can inadvertently be transcribed and may be missed upon review.

## 2014-12-17 NOTE — Telephone Encounter (Signed)
per pof to sch pt appt-gave pt copy of avs °

## 2015-12-04 ENCOUNTER — Telehealth: Payer: Self-pay | Admitting: Internal Medicine

## 2015-12-04 NOTE — Telephone Encounter (Signed)
Per MM moved 11/8 f/u from 315pm to 215pm. Not able to reach patient by phone or leave message. Schedule mailed and comment added to 11/3 lab to send patient for new schedule.

## 2015-12-11 ENCOUNTER — Other Ambulatory Visit: Payer: Medicare (Managed Care)

## 2015-12-11 ENCOUNTER — Ambulatory Visit (HOSPITAL_COMMUNITY): Payer: Medicare Other

## 2015-12-16 ENCOUNTER — Ambulatory Visit: Payer: Medicare (Managed Care) | Admitting: Internal Medicine

## 2015-12-17 ENCOUNTER — Ambulatory Visit: Payer: Medicare (Managed Care) | Admitting: Internal Medicine

## 2016-12-10 ENCOUNTER — Inpatient Hospital Stay (HOSPITAL_COMMUNITY)
Admission: EM | Admit: 2016-12-10 | Discharge: 2016-12-13 | DRG: 682 | Disposition: A | Discharge status: Hospice | Attending: Family Medicine | Admitting: Family Medicine

## 2016-12-10 ENCOUNTER — Encounter (HOSPITAL_COMMUNITY): Payer: Self-pay | Admitting: Emergency Medicine

## 2016-12-10 ENCOUNTER — Emergency Department (HOSPITAL_COMMUNITY)

## 2016-12-10 DIAGNOSIS — S01112A Laceration without foreign body of left eyelid and periocular area, initial encounter: Secondary | ICD-10-CM | POA: Diagnosis present

## 2016-12-10 DIAGNOSIS — F0390 Unspecified dementia without behavioral disturbance: Secondary | ICD-10-CM | POA: Diagnosis present

## 2016-12-10 DIAGNOSIS — Z9981 Dependence on supplemental oxygen: Secondary | ICD-10-CM | POA: Diagnosis not present

## 2016-12-10 DIAGNOSIS — C3490 Malignant neoplasm of unspecified part of unspecified bronchus or lung: Secondary | ICD-10-CM | POA: Diagnosis not present

## 2016-12-10 DIAGNOSIS — Z66 Do not resuscitate: Secondary | ICD-10-CM | POA: Diagnosis present

## 2016-12-10 DIAGNOSIS — Z515 Encounter for palliative care: Secondary | ICD-10-CM | POA: Diagnosis present

## 2016-12-10 DIAGNOSIS — I10 Essential (primary) hypertension: Secondary | ICD-10-CM | POA: Diagnosis present

## 2016-12-10 DIAGNOSIS — I2510 Atherosclerotic heart disease of native coronary artery without angina pectoris: Secondary | ICD-10-CM | POA: Diagnosis present

## 2016-12-10 DIAGNOSIS — M069 Rheumatoid arthritis, unspecified: Secondary | ICD-10-CM | POA: Diagnosis present

## 2016-12-10 DIAGNOSIS — R401 Stupor: Secondary | ICD-10-CM

## 2016-12-10 DIAGNOSIS — E876 Hypokalemia: Secondary | ICD-10-CM | POA: Diagnosis present

## 2016-12-10 DIAGNOSIS — J449 Chronic obstructive pulmonary disease, unspecified: Secondary | ICD-10-CM | POA: Diagnosis present

## 2016-12-10 DIAGNOSIS — Z79899 Other long term (current) drug therapy: Secondary | ICD-10-CM

## 2016-12-10 DIAGNOSIS — R29810 Facial weakness: Secondary | ICD-10-CM | POA: Diagnosis present

## 2016-12-10 DIAGNOSIS — Z87891 Personal history of nicotine dependence: Secondary | ICD-10-CM

## 2016-12-10 DIAGNOSIS — R402414 Glasgow coma scale score 13-15, 24 hours or more after hospital admission: Secondary | ICD-10-CM | POA: Diagnosis not present

## 2016-12-10 DIAGNOSIS — G9340 Encephalopathy, unspecified: Secondary | ICD-10-CM | POA: Diagnosis present

## 2016-12-10 DIAGNOSIS — E119 Type 2 diabetes mellitus without complications: Secondary | ICD-10-CM | POA: Diagnosis present

## 2016-12-10 DIAGNOSIS — W19XXXA Unspecified fall, initial encounter: Secondary | ICD-10-CM | POA: Diagnosis not present

## 2016-12-10 DIAGNOSIS — R5381 Other malaise: Secondary | ICD-10-CM | POA: Diagnosis present

## 2016-12-10 DIAGNOSIS — I714 Abdominal aortic aneurysm, without rupture, unspecified: Secondary | ICD-10-CM | POA: Diagnosis present

## 2016-12-10 DIAGNOSIS — B961 Klebsiella pneumoniae [K. pneumoniae] as the cause of diseases classified elsewhere: Secondary | ICD-10-CM | POA: Diagnosis present

## 2016-12-10 DIAGNOSIS — N179 Acute kidney failure, unspecified: Secondary | ICD-10-CM | POA: Diagnosis not present

## 2016-12-10 DIAGNOSIS — R569 Unspecified convulsions: Secondary | ICD-10-CM | POA: Diagnosis not present

## 2016-12-10 DIAGNOSIS — R0689 Other abnormalities of breathing: Secondary | ICD-10-CM | POA: Diagnosis present

## 2016-12-10 DIAGNOSIS — R402423 Glasgow coma scale score 9-12, at hospital admission: Secondary | ICD-10-CM | POA: Diagnosis present

## 2016-12-10 DIAGNOSIS — Y92009 Unspecified place in unspecified non-institutional (private) residence as the place of occurrence of the external cause: Secondary | ICD-10-CM

## 2016-12-10 DIAGNOSIS — Z7982 Long term (current) use of aspirin: Secondary | ICD-10-CM

## 2016-12-10 DIAGNOSIS — N390 Urinary tract infection, site not specified: Secondary | ICD-10-CM | POA: Diagnosis not present

## 2016-12-10 DIAGNOSIS — R402433 Glasgow coma scale score 3-8, at hospital admission: Secondary | ICD-10-CM | POA: Diagnosis present

## 2016-12-10 DIAGNOSIS — E213 Hyperparathyroidism, unspecified: Secondary | ICD-10-CM | POA: Diagnosis present

## 2016-12-10 DIAGNOSIS — G9341 Metabolic encephalopathy: Secondary | ICD-10-CM | POA: Diagnosis not present

## 2016-12-10 DIAGNOSIS — Z902 Acquired absence of lung [part of]: Secondary | ICD-10-CM

## 2016-12-10 DIAGNOSIS — G92 Toxic encephalopathy: Secondary | ICD-10-CM | POA: Diagnosis not present

## 2016-12-10 DIAGNOSIS — E785 Hyperlipidemia, unspecified: Secondary | ICD-10-CM | POA: Diagnosis present

## 2016-12-10 DIAGNOSIS — Z85118 Personal history of other malignant neoplasm of bronchus and lung: Secondary | ICD-10-CM

## 2016-12-10 DIAGNOSIS — R000 Tachycardia, unspecified: Secondary | ICD-10-CM | POA: Diagnosis present

## 2016-12-10 DIAGNOSIS — Z7984 Long term (current) use of oral hypoglycemic drugs: Secondary | ICD-10-CM

## 2016-12-10 DIAGNOSIS — L8990 Pressure ulcer of unspecified site, unspecified stage: Secondary | ICD-10-CM

## 2016-12-10 DIAGNOSIS — W19XXXD Unspecified fall, subsequent encounter: Secondary | ICD-10-CM | POA: Diagnosis not present

## 2016-12-10 LAB — DIFFERENTIAL
Basophils Absolute: 0 10*3/uL (ref 0.0–0.1)
Basophils Relative: 0 %
Eosinophils Absolute: 0 10*3/uL (ref 0.0–0.7)
Eosinophils Relative: 1 %
LYMPHS ABS: 1.7 10*3/uL (ref 0.7–4.0)
LYMPHS PCT: 29 %
MONO ABS: 0.5 10*3/uL (ref 0.1–1.0)
MONOS PCT: 9 %
NEUTROS ABS: 3.5 10*3/uL (ref 1.7–7.7)
Neutrophils Relative %: 61 %

## 2016-12-10 LAB — URINALYSIS, ROUTINE W REFLEX MICROSCOPIC
Bilirubin Urine: NEGATIVE
Glucose, UA: 50 mg/dL — AB
KETONES UR: 20 mg/dL — AB
Nitrite: POSITIVE — AB
PROTEIN: 100 mg/dL — AB
SQUAMOUS EPITHELIAL / LPF: NONE SEEN
Specific Gravity, Urine: 1.024 (ref 1.005–1.030)
pH: 5 (ref 5.0–8.0)

## 2016-12-10 LAB — I-STAT CG4 LACTIC ACID, ED
Lactic Acid, Venous: 2.3 mmol/L (ref 0.5–1.9)
Lactic Acid, Venous: 2.47 mmol/L (ref 0.5–1.9)

## 2016-12-10 LAB — I-STAT CHEM 8, ED
BUN: 18 mg/dL (ref 6–20)
CALCIUM ION: 1.27 mmol/L (ref 1.15–1.40)
CHLORIDE: 96 mmol/L — AB (ref 101–111)
Creatinine, Ser: 1.2 mg/dL — ABNORMAL HIGH (ref 0.44–1.00)
GLUCOSE: 162 mg/dL — AB (ref 65–99)
HCT: 36 % (ref 36.0–46.0)
Hemoglobin: 12.2 g/dL (ref 12.0–15.0)
Potassium: 3.1 mmol/L — ABNORMAL LOW (ref 3.5–5.1)
Sodium: 139 mmol/L (ref 135–145)
TCO2: 34 mmol/L — ABNORMAL HIGH (ref 22–32)

## 2016-12-10 LAB — RAPID URINE DRUG SCREEN, HOSP PERFORMED
AMPHETAMINES: NOT DETECTED
BENZODIAZEPINES: NOT DETECTED
Barbiturates: NOT DETECTED
Cocaine: NOT DETECTED
OPIATES: NOT DETECTED
Tetrahydrocannabinol: NOT DETECTED

## 2016-12-10 LAB — COMPREHENSIVE METABOLIC PANEL
ALK PHOS: 54 U/L (ref 38–126)
ALT: 25 U/L (ref 14–54)
AST: 33 U/L (ref 15–41)
Albumin: 3.8 g/dL (ref 3.5–5.0)
Anion gap: 9 (ref 5–15)
BUN: 14 mg/dL (ref 6–20)
CALCIUM: 10.1 mg/dL (ref 8.9–10.3)
CO2: 33 mmol/L — AB (ref 22–32)
CREATININE: 1.22 mg/dL — AB (ref 0.44–1.00)
Chloride: 96 mmol/L — ABNORMAL LOW (ref 101–111)
GFR calc non Af Amer: 40 mL/min — ABNORMAL LOW (ref >60)
GFR, EST AFRICAN AMERICAN: 46 mL/min — AB (ref >60)
GLUCOSE: 161 mg/dL — AB (ref 65–99)
Potassium: 3.1 mmol/L — ABNORMAL LOW (ref 3.5–5.1)
SODIUM: 138 mmol/L (ref 135–145)
Total Bilirubin: 1.1 mg/dL (ref 0.3–1.2)
Total Protein: 6.5 g/dL (ref 6.5–8.1)

## 2016-12-10 LAB — CBC
HEMATOCRIT: 37 % (ref 36.0–46.0)
HEMOGLOBIN: 11.4 g/dL — AB (ref 12.0–15.0)
MCH: 27.7 pg (ref 26.0–34.0)
MCHC: 30.8 g/dL (ref 30.0–36.0)
MCV: 90 fL (ref 78.0–100.0)
PLATELETS: 132 10*3/uL — AB (ref 150–400)
RBC: 4.11 MIL/uL (ref 3.87–5.11)
RDW: 14.3 % (ref 11.5–15.5)
WBC: 5.8 10*3/uL (ref 4.0–10.5)

## 2016-12-10 LAB — AMMONIA: Ammonia: 47 umol/L — ABNORMAL HIGH (ref 9–35)

## 2016-12-10 LAB — PROTIME-INR
INR: 1.05
Prothrombin Time: 13.6 seconds (ref 11.4–15.2)

## 2016-12-10 LAB — APTT: APTT: 28 s (ref 24–36)

## 2016-12-10 LAB — I-STAT VENOUS BLOOD GAS, ED
ACID-BASE EXCESS: 6 mmol/L — AB (ref 0.0–2.0)
BICARBONATE: 34.4 mmol/L — AB (ref 20.0–28.0)
O2 SAT: 73 %
TCO2: 37 mmol/L — AB (ref 22–32)
pCO2, Ven: 71.3 mmHg (ref 44.0–60.0)
pH, Ven: 7.292 (ref 7.250–7.430)
pO2, Ven: 45 mmHg (ref 32.0–45.0)

## 2016-12-10 LAB — ETHANOL

## 2016-12-10 LAB — TSH: TSH: 0.204 u[IU]/mL — ABNORMAL LOW (ref 0.350–4.500)

## 2016-12-10 LAB — MRSA PCR SCREENING: MRSA by PCR: NEGATIVE

## 2016-12-10 LAB — I-STAT TROPONIN, ED: Troponin i, poc: 0 ng/mL (ref 0.00–0.08)

## 2016-12-10 LAB — LACTIC ACID, PLASMA: Lactic Acid, Venous: 1.8 mmol/L (ref 0.5–1.9)

## 2016-12-10 MED ORDER — SODIUM CHLORIDE 0.9 % IV SOLN
INTRAVENOUS | Status: AC
  Administered 2016-12-10: 22:00:00 via INTRAVENOUS

## 2016-12-10 MED ORDER — ASPIRIN EC 81 MG PO TBEC
81.0000 mg | DELAYED_RELEASE_TABLET | Freq: Every day | ORAL | Status: DC
  Administered 2016-12-10 – 2016-12-13 (×4): 81 mg via ORAL
  Filled 2016-12-10 (×4): qty 1

## 2016-12-10 MED ORDER — DEXTROSE 5 % IV SOLN
1.0000 g | Freq: Once | INTRAVENOUS | Status: AC
  Administered 2016-12-10: 1 g via INTRAVENOUS
  Filled 2016-12-10: qty 10

## 2016-12-10 MED ORDER — ACETAMINOPHEN 650 MG RE SUPP: 650.0000 mg | Freq: Four times a day (QID) | RECTAL | Status: DC | PRN

## 2016-12-10 MED ORDER — ONDANSETRON HCL 4 MG/2ML IJ SOLN
4.0000 mg | Freq: Once | INTRAMUSCULAR | Status: AC
  Administered 2016-12-10: 4 mg via INTRAVENOUS
  Filled 2016-12-10: qty 2

## 2016-12-10 MED ORDER — ENOXAPARIN SODIUM 40 MG/0.4ML ~~LOC~~ SOLN
40.0000 mg | SUBCUTANEOUS | Status: DC
  Administered 2016-12-10 – 2016-12-12 (×3): 40 mg via SUBCUTANEOUS
  Filled 2016-12-10 (×3): qty 0.4

## 2016-12-10 MED ORDER — ZINC OXIDE 40 % EX OINT: TOPICAL_OINTMENT | Freq: Every day | CUTANEOUS | Status: DC | PRN

## 2016-12-10 MED ORDER — SENNOSIDES-DOCUSATE SODIUM 8.6-50 MG PO TABS: 1.0000 | ORAL_TABLET | Freq: Every day | ORAL | Status: DC | PRN

## 2016-12-10 MED ORDER — ONDANSETRON HCL 4 MG/2ML IJ SOLN
INTRAMUSCULAR | Status: AC
  Administered 2016-12-10: 17:00:00
  Filled 2016-12-10: qty 2

## 2016-12-10 MED ORDER — ONDANSETRON HCL 4 MG PO TABS: 4.0000 mg | ORAL_TABLET | Freq: Four times a day (QID) | ORAL | Status: DC | PRN

## 2016-12-10 MED ORDER — SODIUM CHLORIDE 0.9 % IV BOLUS (SEPSIS)
1000.0000 mL | Freq: Once | INTRAVENOUS | Status: AC
  Administered 2016-12-10: 1000 mL via INTRAVENOUS

## 2016-12-10 MED ORDER — SODIUM CHLORIDE 0.9 % IV SOLN
1000.0000 mg | Freq: Once | INTRAVENOUS | Status: AC
  Administered 2016-12-10: 1000 mg via INTRAVENOUS
  Filled 2016-12-10: qty 20

## 2016-12-10 MED ORDER — LORAZEPAM 2 MG/ML IJ SOLN: 1.0000 mg | INTRAMUSCULAR | Status: DC | PRN

## 2016-12-10 MED ORDER — SODIUM CHLORIDE 0.9% FLUSH
3.0000 mL | Freq: Two times a day (BID) | INTRAVENOUS | Status: DC
  Administered 2016-12-10 – 2016-12-13 (×6): 3 mL via INTRAVENOUS

## 2016-12-10 MED ORDER — PHENYTOIN SODIUM 50 MG/ML IJ SOLN
100.0000 mg | Freq: Three times a day (TID) | INTRAMUSCULAR | Status: DC
  Administered 2016-12-10 – 2016-12-12 (×4): 100 mg via INTRAVENOUS
  Filled 2016-12-10 (×5): qty 2

## 2016-12-10 MED ORDER — METOCLOPRAMIDE HCL 5 MG/ML IJ SOLN
10.0000 mg | Freq: Once | INTRAMUSCULAR | Status: AC
  Administered 2016-12-10: 10 mg via INTRAVENOUS
  Filled 2016-12-10: qty 2

## 2016-12-10 MED ORDER — ONDANSETRON HCL 4 MG/2ML IJ SOLN: 4.0000 mg | Freq: Four times a day (QID) | INTRAMUSCULAR | Status: DC | PRN

## 2016-12-10 MED ORDER — POTASSIUM CHLORIDE 10 MEQ/100ML IV SOLN
10.0000 meq | INTRAVENOUS | Status: AC
  Administered 2016-12-10 – 2016-12-11 (×4): 10 meq via INTRAVENOUS
  Filled 2016-12-10: qty 100

## 2016-12-10 MED ORDER — ZINC OXIDE 10 % EX CREA: 1.0000 "application " | TOPICAL_CREAM | Freq: Every day | CUTANEOUS | Status: DC | PRN

## 2016-12-10 MED ORDER — LORAZEPAM 2 MG/ML IJ SOLN
0.5000 mg | Freq: Once | INTRAMUSCULAR | Status: AC
  Administered 2016-12-10: 0.5 mg via INTRAVENOUS
  Filled 2016-12-10: qty 1

## 2016-12-10 MED ORDER — IOPAMIDOL (ISOVUE-370) INJECTION 76%
INTRAVENOUS | Status: AC
  Administered 2016-12-10: 100 mL via INTRAVENOUS
  Filled 2016-12-10: qty 100

## 2016-12-10 MED ORDER — ACETAMINOPHEN 325 MG PO TABS: 650.0000 mg | ORAL_TABLET | Freq: Four times a day (QID) | ORAL | Status: DC | PRN

## 2016-12-10 MED ORDER — HYDROCODONE-ACETAMINOPHEN 5-325 MG PO TABS
1.0000 | ORAL_TABLET | Freq: Four times a day (QID) | ORAL | Status: DC | PRN
  Administered 2016-12-11: 1 via ORAL
  Filled 2016-12-10: qty 1

## 2016-12-10 NOTE — Consult Note (Addendum)
Neurology Consultation  Reason for Consult: Altered mental status, facial droop Referring Physician: Dr. Leonette Monarch  CC: Altered mental status, fall  History is obtained from chart and patient's husband  HPI: [name] is a 83 y.o. female with a past medical history of an abdominal aortic aneurysm, COPD, diabetes, endometriosis, lung cancer status post resection, rheumatoid arthritis, dementia who has been cognitively declining according to her husband for the past many years who was in her usual state of health until this morning, was brought in because of altered mental status.  Her husband reports that the patient suddenly started to yell while sitting, and he noted that she was on the floor, presumably had a fall, was alert at that time. This happened sometime this morning around noon according to him.  No bowel or bladder incontinence at this time.  She was brought in because she is not back to her normal cognitive baseline.  He did say that she was better initially in the ER, but then in the ER she was noted to have a left facial droop and a very worried and anxious look on her face, with minimal responsiveness to stimulation although she was there with her eyes open.  The symptoms resolved after little while and recurred again. When I examined the patient, she seemed to be in some distress, preferred her right side, had a mild left nasolabial fold flattening and also had mild gaze preference to the right. At baseline, she does not walk, uses a bedside commode.  Needs help with ADLs  LKW: 12 PM on 12/10/2016 tpa given?: no, not a stroke Premorbid modified Rankin scale (mRS): 4-5  [facility]:NOBSJG to obtain due to altered mental status.   Past Medical History:  Diagnosis Date  . AAA (abdominal aortic aneurysm) ([facility])    3.4cm  . Atherosclerotic vascular disease   . COPD (chronic obstructive pulmonary disease) ([facility])   . Diabetes mellitus   . Endometriosis    HX  . Hypercholesterolemia    . Hyperparathyroidism   . Hypertension   . Lung cancer ([facility])    lung ca dx 2004/2009  . Rheumatoid arthritis(714.0)     Family History  Problem Relation Age of Onset  . Kidney disease Mother   . Heart disease Mother     Social History:   reports that she quit smoking about 15 years ago. Her smoking use included Cigarettes. She has never used smokeless tobacco. She reports that she does not drink alcohol or use drugs.  Medications  Current Facility-Administered Medications:  .  fosPHENYtoin (CEREBYX) 1,000 mg PE in sodium chloride 0.9 % 50 mL IVPB, 1,000 mg PE, Intravenous, Once, Cardama, Grayce Sessions, MD .  phenytoin (DILANTIN) injection 100 mg, 100 mg, Intravenous, TID, Cardama, Grayce Sessions, MD  Current Outpatient Prescriptions:  .  albuterol (PROVENTIL HFA;VENTOLIN HFA) 108 (90 BASE) MCG/ACT inhaler, Inhale 2 puffs into the lungs every 6 (six) hours as needed for wheezing or shortness of breath., Disp: , Rfl:  .  amlodipine-atorvastatin (CADUET) 10-20 MG per tablet, Take 1 tablet by mouth every morning. , Disp: , Rfl:  .  aspirin 81 MG tablet, Take 81 mg by mouth every morning. , Disp: , Rfl:  .  clobetasol cream (TEMOVATE) 0.05 %, APPLY TO RASH AS DIRECTED (NOT TO USE ON FACE), Disp: , Rfl: 3 .  ergocalciferol (VITAMIN D2) 50000 UNITS capsule, Take 50,000 Units by mouth every Thursday. , Disp: , Rfl:  .  metFORMIN (GLUCOPHAGE) 500 MG tablet, Take  500 mg by mouth daily with breakfast. , Disp: , Rfl:  .  metoprolol (LOPRESSOR) 50 MG tablet, Take 50 mg by mouth every morning., Disp: , Rfl:  .  ondansetron (ZOFRAN ODT) 4 MG disintegrating tablet, 4mg  ODT q4 hours prn nausea/vomit, Disp: 15 tablet, Rfl: 0 .  ONETOUCH VERIO test strip, TEST EVERY DAY, Disp: , Rfl: 5  Exam: Current vital signs: BP (!) 175/117   Pulse (!) 124   Temp (!) 96.7 F (35.9 C) (Rectal)   Resp (!) 26   SpO2 99%  Vital signs in last 24 hours: Temp:  [96.7 F (35.9 C)] 96.7 F (35.9 C) (11/03  1608) Pulse Rate:  [90-124] 124 (11/03 1715) Resp:  [11-26] 26 (11/03 1715) BP: (145-199)/(59-117) 175/117 (11/03 1700) SpO2:  [98 %-100 %] 99 % (11/03 1715) General: Patient is awake, alert, appears in some distress HEENT: Small bruise in mild laceration over the left eyebrow, clear nares, clear throat CVS: S1-S2 heard, regular rate rhythm Respiratory: Chest clear to auscultation Abdomen: Soft nontender nondistended Extremities: Warm well perfused Neurological exam Mental status: Patient is awake, alert, oriented to self. Her speech is mildly dysarthric.  Does not follow all commands.  Was not able to name simple objects but was able to name herself and answer yes or no appropriately to questions. Naming repetition and fluency were mildly impaired, comprehension seemed intact Attention concentration poor Cranial nerves: Pupils lt 43mm, rt 43mm, both reactive, extraocular was intact but prefers to look at the right side but is able to cross midline to the left, mild facial asymmetry with subtle left nasolabial fold flattening, palate elevates midline, sternocleidomastoid and trapezius strength normal, tongue midline. Motor exam: Antigravity at least 4+/5 right upper extremity, did not raise right lower extremity above bed, left upper extremity weaker than the right 3/5, left lower extremity similar to the right lower extremity at 2/5 Sensory exam: Grimaces equally to noxious stimulus all over.  Was able to say that she feels me touching her leg when I did. Did not perform finger to nose testing Gait was not tested for patient safety  Labs I have reviewed labs in epic and the results pertinent to this consultation are:  CBC    Component Value Date/Time   WBC 5.8 12/10/2016 1415   RBC 4.11 12/10/2016 1415   HGB 12.2 12/10/2016 1429   HGB 12.3 11/10/2014 0907   HCT 36.0 12/10/2016 1429   HCT 37.6 11/10/2014 0907   PLT 132 (L) 12/10/2016 1415   PLT 187 11/10/2014 0907   MCV 90.0  12/10/2016 1415   MCV 88.4 11/10/2014 0907   MCH 27.7 12/10/2016 1415   MCHC 30.8 12/10/2016 1415   RDW 14.3 12/10/2016 1415   RDW 14.3 11/10/2014 0907   LYMPHSABS 1.7 12/10/2016 1415   LYMPHSABS 1.1 11/10/2014 0907   MONOABS 0.5 12/10/2016 1415   MONOABS 0.8 11/10/2014 0907   EOSABS 0.0 12/10/2016 1415   EOSABS 0.1 11/10/2014 0907   BASOSABS 0.0 12/10/2016 1415   BASOSABS 0.0 11/10/2014 0907    CMP     Component Value Date/Time   NA 139 12/10/2016 1429   NA 143 11/10/2014 0907   K 3.1 (L) 12/10/2016 1429   K 4.2 11/10/2014 0907   CL 96 (L) 12/10/2016 1429   CL 99 11/10/2011 1339   CO2 33 (H) 12/10/2016 1415   CO2 36 (H) 11/10/2014 0907   GLUCOSE 162 (H) 12/10/2016 1429   GLUCOSE 134 11/10/2014 0907   GLUCOSE 115 (  H) 11/10/2011 1339   BUN 18 12/10/2016 1429   BUN 22.7 11/10/2014 0907   CREATININE 1.20 (H) 12/10/2016 1429   CREATININE 0.9 11/10/2014 0907   CALCIUM 10.1 12/10/2016 1415   CALCIUM 10.5 (H) 11/10/2014 0907   PROT 6.5 12/10/2016 1415   PROT 7.0 11/10/2014 0907   ALBUMIN 3.8 12/10/2016 1415   ALBUMIN 3.8 11/10/2014 0907   AST 33 12/10/2016 1415   AST 12 11/10/2014 0907   ALT 25 12/10/2016 1415   ALT 12 11/10/2014 0907   ALKPHOS 54 12/10/2016 1415   ALKPHOS 57 11/10/2014 0907   BILITOT 1.1 12/10/2016 1415   BILITOT 0.62 11/10/2014 0907   GFRNONAA 40 (L) 12/10/2016 1415   GFRAA 46 (L) 12/10/2016 1415    Imaging I have reviewed the images obtained: CT-scan of the brain shows no acute changes.  Chronic white matter disease.  Soft tissue contusions on the left frontal scalp and left periorbital region.  Calvarium intact.  Assessment:  83 year old woman with a past history of AAA, COPD, diabetes, endometrial cyst, lung cancer status post resection, rheumatoid arthritis and dementia has been cognitive declining over the past few months to years was noted to be altered after having a fall.  Husband found her down on the ground after hearing a yell, followed  by her becoming altered followed by some improvement in her mentation followed by again being altered. This episode is suspicious for seizure-like activity. She has no known history of seizures.  She does have a history of a malignancy. I would presumptively treat her for seizures.  Impression: Evaluate for seizures Evaluate for structural abnormalities of the brain such as strokes or brain metastases Toxic metabolic encephalopathy Dementia  Recommendations: Loaded with fosphenytoin 1 g x1. Following that continue with phenytoin 100 mg 3 times daily Maintain seizure precautions Obtain an MRI w+w/o when possible Correct underlying toxic metabolic derangements. If her clinical condition does not improve with the antiepileptics, will consider obtaining an EEG at that time. Use Ativan as needed for seizures only if there were more than 5 minutes.  -- Amie Portland, MD [facility] [phone] If 7pm to 7am, please call on call as listed on [facility].

## 2016-12-10 NOTE — ED Notes (Signed)
Pt. sleeping , respirations unlabored , IV sites intact , HR=114/min ,O2 sat= 96% 3 lpm/[facility] .

## 2016-12-10 NOTE — H&P (Addendum)
History and Physical    BELVA KOZIEL WHQ:[id number] DOB: 1932-05-10 DOA: 12/10/2016  PCP: Jilda Panda, MD  Patient coming from: Home  Chief Complaint: altered mental status  HPI: [name] is a 83 y.o. female with medical history significant of RA, h/o lung cancer, HTN, hyperparathyroidism, HLD, DM, COPD (end stage, on hospice per report), AAA, dementia who presents for AMS.  I was not able to speak with family as her husband had left the bedside.  Documentation based on discussion with ED physician, ED bedside nurse and review of documentation by hospice representative and Neurology consultant.  [name] was in her apparently in her house when her husband heard her yell and then heard her fall.  He presented to the room to seeing her having fallen.  Per report, he did not know her last known normal.  She has dementia and has been having some bad days lately.  In the ED, she was noted to be nonverbal.  She had vertiginous type symptoms along with nausea and vomiting.  She had a noticed left facial droop and left sided weakness with a right sided preference and unequal pupils.  She was at first moving all extremities, but then had more difficulty moving her left side.  She was loaded with fosphenytoin and given ativan and so was minimally arousable when I saw her.  She would open eyes to voice and follow some commands like gripping hands and smiling.    Of note, she is reported to be on Hospice and is DNR per their documentation and I confirmed this with her husband.  He understands she has a DNR and I discussed with him the concern that if she were to be intubated she may not be able to come off of ventilatory support.  He understood.    ED Course: In the ED, she had a CT head which did not show any acute mass or intracranial bleed.  She had a CT cervical spine which showed no fracture.  She had a CT chest abdomen and pelvis which showed a possibly enlarging lung mass, an enlarged  AAA compared to 2010 imaging with fat stranding (etiology unclear, but rupture cannot be ruled out), stranding around bowel making diverticulitis possible.  She had labs showing a nitrite + UA with UC pending.  She had a normal WBC.  She had a mildly low K at 3.1 and a Cr of 1.22.  Per review of chart, she is on hospice for end stage COPD.    Review of Systems: As per HPI otherwise patient was not able to answer many questions due to being sedated for possible seizure activity.  Husband reports diarrhea that has been off and on for years.  She further has chronic left sided flank pain which has been going on for about 12 years since her lung cancer surgery.  He denies any report of central abdominal pain, chest pain, and no report of tearing or ripping chest/abdominal pain.  He notes, however, that she does not always complain of new symptoms.    Past Medical History:  Diagnosis Date  . AAA (abdominal aortic aneurysm) (H[facility])    3.4cm  . Atherosclerotic vascular disease   . COPD (chronic obstructive pulmonary disease) ([place])   . Diabetes mellitus   . Endometriosis    HX  . Hypercholesterolemia   . Hyperparathyroidism   . Hypertension   . Lung cancer ([place])    lung ca dx 2004/2009  . Rheumatoid arthritis(714.0)  Past Surgical History:  Procedure Laterality Date  . APPENDECTOMY    . CHOLECYSTECTOMY    . parathroidectomy  2002  . RETINAL DETACHMENT SURGERY  1993  . S/P Left upper lobectomy  2004     reports that she quit smoking about 15 years ago. Her smoking use included Cigarettes. She has never used smokeless tobacco. She reports that she does not drink alcohol or use drugs.  No Known Allergies  Family History  Problem Relation Age of Onset  . Kidney disease Mother   . Heart disease Mother     Prior to Admission medications   Medication Sig Start Date End Date Taking? Authorizing Provider  acetaminophen (TYLENOL) 500 MG tablet Take 500 mg by mouth 2 (two) times daily.   Yes  [provider]  aspirin EC 81 MG tablet Take 81 mg by mouth daily.   Yes [provider]  DM-Phenylephrine-Acetaminophen (VICKS DAYQUIL COLD & FLU) 10-5-325 MG/15ML LIQD Take 15 mLs by mouth every 4 (four) hours as needed (cough, congestion, runny nose).   Yes [provider]  fluocinonide cream (LIDEX) 1.44 % Apply 1 application topically 2 (two) times daily as needed (itching/psoriasis).  10/09/16  Yes [provider]  HYDROcodone-acetaminophen (NORCO/VICODIN) 5-325 MG tablet Take 1 tablet by mouth every 6 (six) hours as needed for moderate pain.   Yes [provider]  hydrOXYzine (ATARAX/VISTARIL) 10 MG tablet Take 10 mg by mouth every 4 (four) hours as needed for itching (psoriasis).   Yes [provider]  loperamide (IMODIUM A-D) 2 MG tablet Take 2-4 mg by mouth See admin instructions. Take 2 tablets (4 mg) by mouth after 1st loose stool, then take 1 tablet (2 mg) after each subsequent loose stool (not to exceed 8 tablets per day)   Yes [provider]  LORazepam (ATIVAN) 0.5 MG tablet Take 0.5 mg by mouth every 4 (four) hours as needed for anxiety.  10/07/16  Yes [provider]  OXYGEN Inhale 2-4 L into the lungs continuous.   Yes [provider]  senna-docusate (SENNA-S) 8.6-50 MG tablet Take 1 tablet by mouth daily as needed for mild constipation.   Yes [provider]  ZINC OXIDE, TOPICAL, 10 % CREA Apply 1 application topically daily as needed (redness).   Yes [provider]  metoprolol (LOPRESSOR) 50 MG tablet Take 50 mg by mouth daily.     [provider]  ondansetron (ZOFRAN ODT) 4 MG disintegrating tablet 4mg  ODT q4 hours prn nausea/vomit Patient not taking: Reported on 12/10/2016 08/01/13   Nat Christen, MD  [facility] VERIO test strip TEST EVERY DAY 10/22/14   [provider]    Physical Exam: Vitals:   12/10/16 1800 12/10/16 1815 12/10/16 1830 12/10/16 1845  BP: (!)  195/118 (!) 201/126 (!) 156/87 (!) 181/110  Pulse: (!) 119 (!) 126 (!) 112 (!) 114  Resp: (!) 23 19 16 16   Temp:      TempSrc:      SpO2: 98% 100% 100% 100%    Constitutional: Lying in bed, large laceration over right eyebrow, alert to voice and sternal rub, GCS 8-9 Vitals:   12/10/16 1800 12/10/16 1815 12/10/16 1830 12/10/16 1845  BP: (!) 195/118 (!) 201/126 (!) 156/87 (!) 181/110  Pulse: (!) 119 (!) 126 (!) 112 (!) 114  Resp: (!) 23 19 16 16   Temp:      TempSrc:      SpO2: 98% 100% 100% 100%   Eyes: PERRL, Left pupil  is smaller than right and less reactive to light, lids and conjunctivae normal.  She has a large bruise/laceration over left eyebrow ENMT: Mucous membranes are dry. Dentition is poor w Neck: no masses, no thyromegaly noted Respiratory:  Decreased breath sounds throughout, she is not wheezing.  No rales.  She is using abdominal and accessory muscles to breath.  97% on 4LNC. UPDATE: On recheck, about 1 hour later, she was breathing much more comfortably.  Cardiovascular: Tachycardic, regular, + systolic murmur Abdomen: no tenderness, no masses palpated. Bowel sounds positive.  Musculoskeletal: She has no clubbing.  She has multiple ecchymoses including on the left knee, left posterior thigh Skin: No swelling or rash.  She has multiple bruises noted above.   GU: She has an external catheter in place draining cloudy yellow urine.   Neurologic: Difficult to assess at this time given alertness.  By report, she is spontaneously moving her limbs, but less on the left.  She was able to grip my hand and move her legs in the bed. She smiled and I noted a left facial droop.  She was recently given ativan.    Labs on Admission: I have personally reviewed following labs and imaging studies  CBC:  Recent Labs Lab 12/10/16 1415 12/10/16 1429  WBC 5.8  --   NEUTROABS 3.5  --   HGB 11.4* 12.2  HCT 37.0 36.0  MCV 90.0  --   PLT 132*  --    Basic Metabolic Panel:  Recent  Labs Lab 12/10/16 1415 12/10/16 1429  NA 138 139  K 3.1* 3.1*  CL 96* 96*  CO2 33*  --   GLUCOSE 161* 162*  BUN 14 18  CREATININE 1.22* 1.20*  CALCIUM 10.1  --    GFR: CrCl cannot be calculated (Unknown ideal weight.). Liver Function Tests:  Recent Labs Lab 12/10/16 1415  AST 33  ALT 25  ALKPHOS 54  BILITOT 1.1  PROT 6.5  ALBUMIN 3.8   No results for input(s): LIPASE, AMYLASE in the last 168 hours.  Recent Labs Lab 12/10/16 1551  AMMONIA 47*   Coagulation Profile:  Recent Labs Lab 12/10/16 1415  INR 1.05   Cardiac Enzymes: No results for input(s): CKTOTAL, CKMB, CKMBINDEX, TROPONINI in the last 168 hours. BNP (last 3 results) No results for input(s): PROBNP in the last 8760 hours. HbA1C: No results for input(s): HGBA1C in the last 72 hours. CBG: No results for input(s): GLUCAP in the last 168 hours. Lipid Profile: No results for input(s): CHOL, HDL, LDLCALC, TRIG, CHOLHDL, LDLDIRECT in the last 72 hours. Thyroid Function Tests: No results for input(s): TSH, T4TOTAL, FREET4, T3FREE, THYROIDAB in the last 72 hours. Anemia Panel: No results for input(s): VITAMINB12, FOLATE, FERRITIN, TIBC, IRON, RETICCTPCT in the last 72 hours. Urine analysis:    Component Value Date/Time   COLORURINE AMBER (A) 12/10/2016 1623   APPEARANCEUR CLOUDY (A) 12/10/2016 1623   LABSPEC 1.024 12/10/2016 1623   PHURINE 5.0 12/10/2016 1623   GLUCOSEU 50 (A) 12/10/2016 1623   HGBUR SMALL (A) 12/10/2016 1623   BILIRUBINUR NEGATIVE 12/10/2016 1623   KETONESUR 20 (A) 12/10/2016 1623   PROTEINUR 100 (A) 12/10/2016 1623   UROBILINOGEN 1.0 03/09/2008 1105   NITRITE POSITIVE (A) 12/10/2016 1623   LEUKOCYTESUR LARGE (A) 12/10/2016 1623    Radiological Exams on Admission: Dg Chest 1 View  Result Date: 12/10/2016 CLINICAL DATA:  Patient status post fall. Altered mental status. Right hip pain. EXAM: CHEST 1 VIEW COMPARISON:  Chest CT  11/10/2014. FINDINGS: Patient is rotated to the  left. Cardiomegaly. Aortic atherosclerosis. Multiple monitoring leads overlie the patient. No large area of pulmonary consolidation. No pleural effusion or pneumothorax. IMPRESSION: Limited rotated portable exam. Cardiomegaly. No large area pulmonary consolidation. Electronically Signed   By: Lovey Newcomer M.D.   On: 12/10/2016 15:05   Ct Head Wo Contrast  Result Date: 12/10/2016 CLINICAL DATA:  Initial evaluation for acute trauma, fall. EXAM: CT HEAD WITHOUT CONTRAST CT CERVICAL SPINE WITHOUT CONTRAST TECHNIQUE: Multidetector CT imaging of the head and cervical spine was performed following the standard protocol without intravenous contrast. Multiplanar CT image reconstructions of the cervical spine were also generated. COMPARISON:  Prior CT from 12/14/2007. FINDINGS: CT HEAD FINDINGS Brain: Moderate cerebral atrophy with chronic small vessel ischemic disease. No acute intracranial hemorrhage. No acute large vessel territory infarct. No mass lesion, midline shift or mass effect. No hydrocephalus. No extra-axial fluid collection. Vascular: No hyperdense vessel. Scattered vascular calcifications noted within the carotid siphons. Skull: CED soft tissue contusions present at the left forehead and left periorbital region. Calvarium intact. Sinuses/Orbits: Globes and orbital soft tissues demonstrate no acute abnormality. Postsurgical changes from prior scleral banding present on the left. Patient status post cataract extraction bilaterally. Axial myopia noted. Minimal mucosal thickening noted within the ethmoidal air cells. Paranasal sinuses are otherwise clear. No mastoid effusion. Other: None. CT CERVICAL SPINE FINDINGS Alignment:  Study degraded by motion artifact. Vertebral bodies normally aligned with preservation of the normal cervical lordosis. No listhesis. Skull base and vertebrae: Skullbase intact. Mild rotation of C1 on C2 likely positional. Dens intact. Vertebral body heights maintained. No acute fracture.  Soft tissues and spinal canal: Soft tissues of the neck demonstrate no acute abnormality. No prevertebral edema. Vascular calcifications present about the carotid bifurcations. Scattered calcifications with subcentimeter hypodense nodules noted within the thyroid, of doubtful significance. Disc levels: Mild to moderate degenerate spondylolysis at C5-6 and C6-7. Upper chest: Visualized upper chest demonstrates no acute abnormality. Visualized lung apices are clear. Other: None. IMPRESSION: CT BRAIN: 1. No acute intracranial process. 2. Soft tissue contusions at the left frontal scalp and left periorbital region. Calvarium intact. 3. Moderate cerebral atrophy with chronic small vessel ischemic disease. CT CERVICAL SPINE: 1. No acute traumatic injury within cervical spine. 2. Mild-to-moderate degenerative spondylolysis at C5-6 and C6-7. Electronically Signed   By: Jeannine Boga M.D.   On: 12/10/2016 16:11   Ct Cervical Spine Wo Contrast  Result Date: 12/10/2016 CLINICAL DATA:  Initial evaluation for acute trauma, fall. EXAM: CT HEAD WITHOUT CONTRAST CT CERVICAL SPINE WITHOUT CONTRAST TECHNIQUE: Multidetector CT imaging of the head and cervical spine was performed following the standard protocol without intravenous contrast. Multiplanar CT image reconstructions of the cervical spine were also generated. COMPARISON:  Prior CT from 12/14/2007. FINDINGS: CT HEAD FINDINGS Brain: Moderate cerebral atrophy with chronic small vessel ischemic disease. No acute intracranial hemorrhage. No acute large vessel territory infarct. No mass lesion, midline shift or mass effect. No hydrocephalus. No extra-axial fluid collection. Vascular: No hyperdense vessel. Scattered vascular calcifications noted within the carotid siphons. Skull: CED soft tissue contusions present at the left forehead and left periorbital region. Calvarium intact. Sinuses/Orbits: Globes and orbital soft tissues demonstrate no acute abnormality.  Postsurgical changes from prior scleral banding present on the left. Patient status post cataract extraction bilaterally. Axial myopia noted. Minimal mucosal thickening noted within the ethmoidal air cells. Paranasal sinuses are otherwise clear. No mastoid effusion. Other: None. CT CERVICAL SPINE FINDINGS Alignment:  Study degraded  by motion artifact. Vertebral bodies normally aligned with preservation of the normal cervical lordosis. No listhesis. Skull base and vertebrae: Skullbase intact. Mild rotation of C1 on C2 likely positional. Dens intact. Vertebral body heights maintained. No acute fracture. Soft tissues and spinal canal: Soft tissues of the neck demonstrate no acute abnormality. No prevertebral edema. Vascular calcifications present about the carotid bifurcations. Scattered calcifications with subcentimeter hypodense nodules noted within the thyroid, of doubtful significance. Disc levels: Mild to moderate degenerate spondylolysis at C5-6 and C6-7. Upper chest: Visualized upper chest demonstrates no acute abnormality. Visualized lung apices are clear. Other: None. IMPRESSION: CT BRAIN: 1. No acute intracranial process. 2. Soft tissue contusions at the left frontal scalp and left periorbital region. Calvarium intact. 3. Moderate cerebral atrophy with chronic small vessel ischemic disease. CT CERVICAL SPINE: 1. No acute traumatic injury within cervical spine. 2. Mild-to-moderate degenerative spondylolysis at C5-6 and C6-7. Electronically Signed   By: Jeannine Boga M.D.   On: 12/10/2016 16:11   Dg Knee Complete 4 Views Left  Result Date: 12/10/2016 CLINICAL DATA:  Patient status post fall. Knee pain. Initial encounter. EXAM: LEFT KNEE - COMPLETE 4+ VIEW COMPARISON:  None. FINDINGS: Normal anatomic alignment. Tricompartmental osteophytosis. Chondrocalcinosis. No evidence for acute fracture dislocation. Regional soft tissues are unremarkable. No joint effusion. IMPRESSION: No acute osseous  abnormality. Degenerative changes. Electronically Signed   By: Lovey Newcomer M.D.   On: 12/10/2016 15:07   Ct Angio Chest/abd/pel For Dissection W And/or Wo Contrast  Result Date: 12/10/2016 CLINICAL DATA:  Patient with history of abdominal aortic aneurysm. Recent fall. History of lung cancer. Vomiting. EXAM: CT ANGIOGRAPHY CHEST, ABDOMEN AND PELVIS TECHNIQUE: Multidetector CT imaging through the chest, abdomen and pelvis was performed using the standard protocol during bolus administration of intravenous contrast. Multiplanar reconstructed images and MIPs were obtained and reviewed to evaluate the vascular anatomy. CONTRAST:  80 cc Isovue 370 COMPARISON:  CT chest 11/10/2014. FINDINGS: CTA CHEST FINDINGS Cardiovascular: Normal heart size. Coronary arterial vascular calcifications. Thoracic aortic vascular calcifications. No peripheral high attenuation within the thoracic aorta to suggest acute intramural hematoma. Mediastinum/Nodes: No enlarged axillary, mediastinal or hilar lymphadenopathy. Small hiatal hernia. Lungs/Pleura: Central airways are patent. Slight interval increase in right paramediastinal soft tissue measuring up to 2 cm (image 44; series 7), previously 1.3 cm. Subpleural airspace opacities within the medial right lower lobe (image 45; series 2). New 11 mm ground-glass nodule within the right lower lobe (image 69; series 7). New 4 mm right lower lobe nodule (image 57; series 7). No pleural effusion or pneumothorax. Musculoskeletal: Thoracic spine degenerative changes. No aggressive or acute appearing osseous lesions. Review of the MIP images confirms the above findings. CTA ABDOMEN AND PELVIS FINDINGS VASCULAR Aorta: Bilobed abdominal aortic aneurysm originating at the level of the celiac axis and the left renal artery and extending to the distal abdominal aorta. Aneurysm sac at the level of the origin of the left renal artery measures 5.0 x 5.0 cm (image 98; series 6). This is increased from prior  exam where it measured 4.3 x 4.8 cm. The inferior aspect of the bilobed aneurysm measures 4.5 x 4.6 cm, previously 2.8 x 3.0 cm. There is nonspecific fat stranding about the abdominal aortic aneurysm. Celiac: Extensive atherosclerotic narrowing at the origin and along the course of the celiac axis. SMA: Patent however narrowed at the origin with calcified atherosclerotic plaque. Renals: The right renal artery is patent however there is focal saccular aneurysm at the origin of the right renal  artery which measures 1.4 cm (image 76; series 9). The left renal artery is patent. There is focal saccular outpouching at the level of the origin measuring 2.2 cm (image 82; series 9). IMA: Patent Veins: Unremarkable Review of the MIP images confirms the above findings. NON-VASCULAR Hepatobiliary: The liver is normal in size and contour. No focal hepatic lesion is identified. No intrahepatic or extrahepatic biliary ductal dilatation. Pancreas: Unchanged 2.2 cm low-attenuation lesion within the pancreatic body (image 92; series 6). Spleen: Unremarkable Adrenals/Urinary Tract: Re- demonstrated 4.2 x 2.9 cm left adrenal nodule. Right adrenal gland is normal. Kidneys enhance symmetrically with contrast. No hydronephrosis. There is a 2.3 cm partially exophytic cyst off the inferior pole the left kidney. Stomach/Bowel: Descending and sigmoid colonic diverticulosis. Mild fat stranding about the sigmoid colon. No evidence for bowel obstruction. No free fluid or free intraperitoneal air. Normal morphology of the stomach. Lymphatic: Multiple prominent aortocaval lymph nodes are demonstrated measuring up to 1.5 cm (image 116; series 6), previously 1.5 cm. No new or progressive retroperitoneal adenopathy. Reproductive: Uterus and adnexal structures are unremarkable. Other: None. Musculoskeletal: Lumbar spine degenerative changes. No aggressive or acute appearing osseous lesions. Review of the MIP images confirms the above findings.  IMPRESSION: 1. There is a bilobed abdominal aortic aneurysm which originates at the level of the celiac axis and extends caudally to near the bifurcation. There is fat stranding about the aneurysm sac which is nonspecific in etiology. Given the fat stranding about the aortic aneurysm sac, impending rupture cannot be excluded. 2. Slight interval progression of airspace opacity within the medial right lung and right perihilar location. Recommend attention on follow-up to exclude the possibility of disease progression 3. Mild fat stranding about the sigmoid colon raising the possibility of diverticulitis. 4. There a few small pulmonary nodules within the right lung as detailed above. Recommend attention on follow-up chest CT. 5. Re- demonstrated pancreatic and adrenal lesions, likely benign in etiology, recommend attention on follow-up. 6. Unchanged T6 wedge compression deformity. 7. Critical Value/emergent results were called by telephone at the time of interpretation on 12/10/2016 at 6:03 pm to Dr. Addison Lank , who verbally acknowledged these results. Electronically Signed   By: Lovey Newcomer M.D.   On: 12/10/2016 18:15   Dg Hip Unilat With Pelvis 2-3 Views Right  Result Date: 12/10/2016 CLINICAL DATA:  Right hip pain following a fall today. EXAM: DG HIP (WITH OR WITHOUT PELVIS) 2-3V RIGHT COMPARISON:  Abdomen and pelvis CT dated 04/17/2008. FINDINGS: Diffuse osteopenia. No fracture or dislocation seen. Lower lumbar spine degenerative changes. Atheromatous arterial calcifications with a distal abdominal aortic aneurysm measuring 5.2 cm in maximum transverse diameter without correction for magnification. This measured 4.5 cm in maximum diameter 04/17/2008. IMPRESSION: 1. Infrarenal abdominal aortic aneurysm measuring 5.2 cm in maximum transverse diameter without correction for magnification. This measured 4.5 cm on 04/17/2008. Further evaluation with an abdomen and pelvis CT with contrast is recommended. 2. No hip  fracture or dislocation seen. Electronically Signed   By: Claudie Revering M.D.   On: 12/10/2016 15:11    EKG: Independently reviewed. NSR, LAFB  Assessment/Plan Encephalopathy acute - DDx includes metabolic encephalopathy (renal failure unknown duration (last labs in 2016 normal Cr), nitrite + UA making UTI likely, other cause, h/o end stage COPD), neurological (brain metastasis given her history of small cell lung cancer, stroke, seizure of other etiology), acute infectious (noted above, concern for diverticulitis on CT abdomen).  - For neurological causes: Reviewed Neurology note, less concerned for  stroke and more for acute seizures.  MRI brain pending.  She was loaded with fosphenytoin and now on phenytoin IV TID.  Neuro checks ordered, seizure precautions.  Monitor in the SDU given GCS and intermittent altered mental status.  - For concern for UTI - she has been placed on rocephin, Urine culture is pending - If not improved, consider EEG - PRN ativan - Continue oxygen therapy for COPD  UTI, nitrite + UA on admission  - Rocephin, monitor in SDU - Lactic acid mildly elevated, trend - IVF with NS at 75cc/hr (last TTE from 2009 with normal EF) - Follow UC  Mild Hypokalemia - Replace with IV KCL 60meq and recheck in the AM  AKI vs. CKD - Unknown baseline Cr at this time, 1.22 in the ED.  Last checked in our system in 2016 - IVF with NS at 75cc/hr - [place] BMET in the AM  Fall - Unknown etiology, but thought to be vertiginous on original presentation to the ED - When more awake, consider PT/OT - Review of imaging showed no acute fracture.    COPD (chronic obstructive pulmonary disease) - Continue supplemental O2 to maintain O2 saturation of 88 - 92%.  She has elevated CO2 on ABG obtained today.  She also has an elevated Bicarb which has been chronic since 2013.  I believe this likely represents a chronic state for her given her within normal range pH - She is at risk for further  hypercarbia if her O2 saturation is maintained higher than 92%    Hypertension, chronic with tachycardia - BP is mildly elevated when I saw her, will continue allowing for permissive HTN as we are not clear if she has had a stroke.  She further has tachycardia - Appears her only BP medication is metoprolol, but reported to pharm tech that she has not taken for a few weeks given normal BP at home.  She may be having rebound tachycardia from coming off of metoprolol - Monitor on telemetry - Check TSH    AAA (abdominal aortic aneurysm) - Appears to have gotten bigger.  She has no reported abdominal pain and none on exam.  Will need to monitor closely and consider getting vascular surgery opinion  - Monitor in SDU    H/O Small cell lung cancer - Monitor, possible enlarging mass on CT of the chest - MRI brain for possible brain metastasis.    DVT prophylaxis: Lovenox (no intracranial bleeding noted) Code Status: DNR based review of hospice note and discussion with husband.  Family Communication: Husband, Glendell Docker, at bedside tonight.   Disposition Plan: Admit to SDU, unclear disposition, may need higher level of care (SNF) on discharge.  Consults called: Neurology, Rory Percy by ED Admission status: Admit, SDU   Gilles Chiquito MD [facility] Pager [phone]  If 7PM-7AM, please contact night-coverage [email or web address] Password [facility]  12/10/2016, 7:13 PM

## 2016-12-10 NOTE — ED Notes (Signed)
Admitting MD at bedside speaking with pt.'s spouse on pt.'s admission and plan of care .

## 2016-12-10 NOTE — ED Provider Notes (Signed)
[facility] EMERGENCY DEPARTMENT Provider Note  CSN: [id number] Arrival date & time: 12/10/16 1355  Chief Complaint(s) Altered Mental Status  HPI Lisa MOLLENHAUER is a 83 y.o. female with an extensive past medical history listed below who presents to the emergency department after a fall resulting in altered mental status.  Husband reports that the patient was sitting in the then when he heard the patient yell.  When he came into the room he noted that the patient was on the floor, alert but altered.  Patient's husband cannot tell me exactly when this occurred or when the last time he noted her at her normal baseline mental status.  He did report that she was doing better earlier this morning.  At baseline the husband states that the patient is mildly disoriented due to dementia and has good and bad days.  EMS was called who transported the patient to the emergency department.  She remained hemodynamically stable in route.  Remainder of history, ROS, and physical exam limited due to patient's condition (AMS). Additional information was obtained from EMS and family.   Level V Caveat.    HPI  Past Medical History Past Medical History:  Diagnosis Date  . AAA (abdominal aortic aneurysm) (HCC)    3.4cm  . Atherosclerotic vascular disease   . COPD (chronic obstructive pulmonary disease) ([place])   . Diabetes mellitus   . Endometriosis    HX  . Hypercholesterolemia   . Hyperparathyroidism   . Hypertension   . Lung cancer ([place])    lung ca dx 2004/2009  . Rheumatoid arthritis(714.0)    Patient Active Problem List   Diagnosis Date Noted  . Small cell lung cancer ([place]) 11/13/2013  . COPD (chronic obstructive pulmonary disease) ([place])   . Hypertension   . Lung cancer ([place])   . Rheumatoid arthritis(714.0)   . Hyperparathyroidism   . Endometriosis   . Hypercholesterolemia   . AAA (abdominal aortic aneurysm) ([place])   . Atherosclerotic vascular disease    Home  Medication(s) Prior to Admission medications   Medication Sig Start Date End Date Taking? Authorizing Provider  albuterol (PROVENTIL HFA;VENTOLIN HFA) 108 (90 BASE) MCG/ACT inhaler Inhale 2 puffs into the lungs every 6 (six) hours as needed for wheezing or shortness of breath.    [provider]  amlodipine-atorvastatin (CADUET) 10-20 MG per tablet Take 1 tablet by mouth every morning.     [provider]  aspirin 81 MG tablet Take 81 mg by mouth every morning.     [provider]  clobetasol cream (TEMOVATE) 0.05 % APPLY TO RASH AS DIRECTED (NOT TO USE ON FACE) 11/08/14   [provider]  ergocalciferol (VITAMIN D2) 50000 UNITS capsule Take 50,000 Units by mouth every Thursday.     [provider]  metFORMIN (GLUCOPHAGE) 500 MG tablet Take 500 mg by mouth daily with breakfast.     [provider]  metoprolol (LOPRESSOR) 50 MG tablet Take 50 mg by mouth every morning.    [provider]  ondansetron (ZOFRAN ODT) 4 MG disintegrating tablet 4mg  ODT q4 hours prn nausea/vomit 08/01/13   Nat Christen, MD  [facility] VERIO test strip TEST EVERY DAY 10/22/14   [provider]  Past Surgical History Past Surgical History:  Procedure Laterality Date  . APPENDECTOMY    . CHOLECYSTECTOMY    . parathroidectomy  2002  . RETINAL DETACHMENT SURGERY  1993  . S/P Left upper lobectomy  2004   Family History Family History  Problem Relation Age of Onset  . Kidney disease Mother   . Heart disease Mother     Social History Social History  Substance Use Topics  . Smoking status: Former Smoker    Types: Cigarettes    Quit date: 02/07/2001  . Smokeless tobacco: Never Used  . Alcohol use No   Allergies Patient has no known allergies.  Review of Systems Review of Systems  Unable to perform ROS: Mental status  change  Endocrine: Negative for heat intolerance.    Physical Exam Vital Signs  I have reviewed the triage vital signs BP (!) 176/95   Pulse (!) 115   Temp (!) 96.7 F (35.9 C) (Rectal)   Resp 18   SpO2 100%   Physical Exam  Constitutional: She appears well-developed and well-nourished. No distress.  HENT:  Head: Normocephalic and atraumatic.  Nose: Nose normal.  Eyes: Conjunctivae and EOM are normal. Right eye exhibits no discharge. Left eye exhibits no discharge. No scleral icterus.  Pupils unequal.  Right pupil 3 mm and reactive.  Left pupil 2 mm and reactive.  Neck: Normal range of motion. Neck supple.  Cardiovascular: Normal rate and regular rhythm.  Exam reveals no gallop and no friction rub.   No murmur heard. Pulmonary/Chest: Effort normal and breath sounds normal. No stridor. No respiratory distress. She has no rales.  Abdominal: Soft. She exhibits no distension. There is no tenderness.  Musculoskeletal: She exhibits no edema or tenderness.  Neurological: She is alert.  Patient is nonverbal.  Not following commands at this time.  Skin: Skin is warm and dry. No rash noted. She is not diaphoretic. No erythema.  Psychiatric: She has a normal mood and affect.  Vitals reviewed.   ED Results and Treatments Labs (all labs ordered are listed, but only abnormal results are displayed) Labs Reviewed  CBC - Abnormal; Notable for the following:       Result Value   Hemoglobin 11.4 (*)    Platelets 132 (*)    All other components within normal limits  COMPREHENSIVE METABOLIC PANEL - Abnormal; Notable for the following:    Potassium 3.1 (*)    Chloride 96 (*)    CO2 33 (*)    Glucose, Bld 161 (*)    Creatinine, Ser 1.22 (*)    GFR calc non Af Amer 40 (*)    GFR calc Af Amer 46 (*)    All other components within normal limits  I-STAT CHEM 8, ED - Abnormal; Notable for the following:    Potassium 3.1 (*)    Chloride 96 (*)    Creatinine, Ser 1.20 (*)    Glucose, Bld  162 (*)    TCO2 34 (*)    All other components within normal limits  I-STAT CG4 LACTIC ACID, ED - Abnormal; Notable for the following:    Lactic Acid, Venous 2.30 (*)    All other components within normal limits  I-STAT VENOUS BLOOD GAS, ED - Abnormal; Notable for the following:    pCO2, Ven 71.3 (*)    Bicarbonate 34.4 (*)    TCO2 37 (*)    Acid-Base Excess 6.0 (*)    All other components within normal limits  URINE CULTURE  PROTIME-INR  APTT  DIFFERENTIAL  URINALYSIS, ROUTINE W REFLEX MICROSCOPIC  AMMONIA  ETHANOL  RAPID URINE DRUG SCREEN, HOSP PERFORMED  I-STAT TROPONIN, ED  CBG MONITORING, ED  I-STAT TROPONIN, ED  I-STAT CHEM 8, ED                                                                                                                         EKG  EKG Interpretation  Date/Time:  Saturday December 10 2016 14:14:18 EDT Ventricular Rate:  90 PR Interval:    QRS Duration: 116 QT Interval:  380 QTC Calculation: 465 R Axis:   -54 Text Interpretation:  Normal sinus rhythm Left anterior fascicular block Otherwise no significant change Confirmed by Addison Lank [phone]) on 12/10/2016 3:28:27 PM      Radiology Dg Chest 1 View  Result Date: 12/10/2016 CLINICAL DATA:  Patient status post fall. Altered mental status. Right hip pain. EXAM: CHEST 1 VIEW COMPARISON:  Chest CT 11/10/2014. FINDINGS: Patient is rotated to the left. Cardiomegaly. Aortic atherosclerosis. Multiple monitoring leads overlie the patient. No large area of pulmonary consolidation. No pleural effusion or pneumothorax. IMPRESSION: Limited rotated portable exam. Cardiomegaly. No large area pulmonary consolidation. Electronically Signed   By: Lovey Newcomer M.D.   On: 12/10/2016 15:05   Ct Head Wo Contrast  Result Date: 12/10/2016 CLINICAL DATA:  Initial evaluation for acute trauma, fall. EXAM: CT HEAD WITHOUT CONTRAST CT CERVICAL SPINE WITHOUT CONTRAST TECHNIQUE: Multidetector CT imaging of the head and  cervical spine was performed following the standard protocol without intravenous contrast. Multiplanar CT image reconstructions of the cervical spine were also generated. COMPARISON:  Prior CT from 12/14/2007. FINDINGS: CT HEAD FINDINGS Brain: Moderate cerebral atrophy with chronic small vessel ischemic disease. No acute intracranial hemorrhage. No acute large vessel territory infarct. No mass lesion, midline shift or mass effect. No hydrocephalus. No extra-axial fluid collection. Vascular: No hyperdense vessel. Scattered vascular calcifications noted within the carotid siphons. Skull: CED soft tissue contusions present at the left forehead and left periorbital region. Calvarium intact. Sinuses/Orbits: Globes and orbital soft tissues demonstrate no acute abnormality. Postsurgical changes from prior scleral banding present on the left. Patient status post cataract extraction bilaterally. Axial myopia noted. Minimal mucosal thickening noted within the ethmoidal air cells. Paranasal sinuses are otherwise clear. No mastoid effusion. Other: None. CT CERVICAL SPINE FINDINGS Alignment:  Study degraded by motion artifact. Vertebral bodies normally aligned with preservation of the normal cervical lordosis. No listhesis. Skull base and vertebrae: Skullbase intact. Mild rotation of C1 on C2 likely positional. Dens intact. Vertebral body heights maintained. No acute fracture. Soft tissues and spinal canal: Soft tissues of the neck demonstrate no acute abnormality. No prevertebral edema. Vascular calcifications present about the carotid bifurcations. Scattered calcifications with subcentimeter hypodense nodules noted within the thyroid, of doubtful significance. Disc levels: Mild to moderate degenerate spondylolysis at C5-6 and C6-7. Upper chest: Visualized upper chest demonstrates no acute abnormality. Visualized lung apices are clear. Other: None. IMPRESSION: CT  BRAIN: 1. No acute intracranial process. 2. Soft tissue  contusions at the left frontal scalp and left periorbital region. Calvarium intact. 3. Moderate cerebral atrophy with chronic small vessel ischemic disease. CT CERVICAL SPINE: 1. No acute traumatic injury within cervical spine. 2. Mild-to-moderate degenerative spondylolysis at C5-6 and C6-7. Electronically Signed   By: Jeannine Boga M.D.   On: 12/10/2016 16:11   Ct Cervical Spine Wo Contrast  Result Date: 12/10/2016 CLINICAL DATA:  Initial evaluation for acute trauma, fall. EXAM: CT HEAD WITHOUT CONTRAST CT CERVICAL SPINE WITHOUT CONTRAST TECHNIQUE: Multidetector CT imaging of the head and cervical spine was performed following the standard protocol without intravenous contrast. Multiplanar CT image reconstructions of the cervical spine were also generated. COMPARISON:  Prior CT from 12/14/2007. FINDINGS: CT HEAD FINDINGS Brain: Moderate cerebral atrophy with chronic small vessel ischemic disease. No acute intracranial hemorrhage. No acute large vessel territory infarct. No mass lesion, midline shift or mass effect. No hydrocephalus. No extra-axial fluid collection. Vascular: No hyperdense vessel. Scattered vascular calcifications noted within the carotid siphons. Skull: CED soft tissue contusions present at the left forehead and left periorbital region. Calvarium intact. Sinuses/Orbits: Globes and orbital soft tissues demonstrate no acute abnormality. Postsurgical changes from prior scleral banding present on the left. Patient status post cataract extraction bilaterally. Axial myopia noted. Minimal mucosal thickening noted within the ethmoidal air cells. Paranasal sinuses are otherwise clear. No mastoid effusion. Other: None. CT CERVICAL SPINE FINDINGS Alignment:  Study degraded by motion artifact. Vertebral bodies normally aligned with preservation of the normal cervical lordosis. No listhesis. Skull base and vertebrae: Skullbase intact. Mild rotation of C1 on C2 likely positional. Dens intact.  Vertebral body heights maintained. No acute fracture. Soft tissues and spinal canal: Soft tissues of the neck demonstrate no acute abnormality. No prevertebral edema. Vascular calcifications present about the carotid bifurcations. Scattered calcifications with subcentimeter hypodense nodules noted within the thyroid, of doubtful significance. Disc levels: Mild to moderate degenerate spondylolysis at C5-6 and C6-7. Upper chest: Visualized upper chest demonstrates no acute abnormality. Visualized lung apices are clear. Other: None. IMPRESSION: CT BRAIN: 1. No acute intracranial process. 2. Soft tissue contusions at the left frontal scalp and left periorbital region. Calvarium intact. 3. Moderate cerebral atrophy with chronic small vessel ischemic disease. CT CERVICAL SPINE: 1. No acute traumatic injury within cervical spine. 2. Mild-to-moderate degenerative spondylolysis at C5-6 and C6-7. Electronically Signed   By: Jeannine Boga M.D.   On: 12/10/2016 16:11   Dg Knee Complete 4 Views Left  Result Date: 12/10/2016 CLINICAL DATA:  Patient status post fall. Knee pain. Initial encounter. EXAM: LEFT KNEE - COMPLETE 4+ VIEW COMPARISON:  None. FINDINGS: Normal anatomic alignment. Tricompartmental osteophytosis. Chondrocalcinosis. No evidence for acute fracture dislocation. Regional soft tissues are unremarkable. No joint effusion. IMPRESSION: No acute osseous abnormality. Degenerative changes. Electronically Signed   By: Lovey Newcomer M.D.   On: 12/10/2016 15:07   Dg Hip Unilat With Pelvis 2-3 Views Right  Result Date: 12/10/2016 CLINICAL DATA:  Right hip pain following a fall today. EXAM: DG HIP (WITH OR WITHOUT PELVIS) 2-3V RIGHT COMPARISON:  Abdomen and pelvis CT dated 04/17/2008. FINDINGS: Diffuse osteopenia. No fracture or dislocation seen. Lower lumbar spine degenerative changes. Atheromatous arterial calcifications with a distal abdominal aortic aneurysm measuring 5.2 cm in maximum transverse diameter  without correction for magnification. This measured 4.5 cm in maximum diameter 04/17/2008. IMPRESSION: 1. Infrarenal abdominal aortic aneurysm measuring 5.2 cm in maximum transverse diameter without correction for magnification. This measured  4.5 cm on 04/17/2008. Further evaluation with an abdomen and pelvis CT with contrast is recommended. 2. No hip fracture or dislocation seen. Electronically Signed   By: Claudie Revering M.D.   On: 12/10/2016 15:11   Pertinent labs & imaging results that were available during my care of the patient were reviewed by me and considered in my medical decision making (see chart for details).  Medications Ordered in ED Medications  iopamidol (ISOVUE-370) 76 % injection (not administered)  ondansetron (ZOFRAN) injection 4 mg (4 mg Intravenous Given 12/10/16 1545)  sodium chloride 0.9 % bolus 1,000 mL (1,000 mLs Intravenous New Bag/Given 12/10/16 1545)                                                                                                                                    Procedures Procedures  (including critical care time)  Medical Decision Making / ED Course I have reviewed the nursing notes for this encounter and the patient's prior records (if available in EHR or on provided paperwork).  Clinical Course as of Dec 10 2328  Sat Dec 10, 2016  1400 Altered mental status of unknown etiology at this time.  Concerning for intracranial bleed however CT of the head was negative.   [PC]  6767 On reevaluation patient is now following commands and noted to have left facial droop with left-sided weakness.  Patient is nonverbal still.  Feel that this is likely a large vessel occlusion CVA.  Given the unknown last known normal and her history of AAA will discuss case with neurology  [PC]  1728 Discussed case with neurology who evaluated the patient and would like MRI with and without contrast to assess for CVA versus metastatic disease.  Her weakness has significantly  improved so there is a concern that this might be seizure-like activity.  [PC]  2094 UA noted to be suspicious for urinary tract infection.  Given empiric Rocephin.  [PC]  7096 CTA of the chest abdomen and pelvis was obtained to assess for pulmonary emboli and her AAA given the increased size noted on plain films.  [PC]  2836 CTA without evidence of pulmonary embolism.  There was an increase in AAA without evidence of rupture.  They did not know stranding adjacent to sigmoid colon concerning for chronic versus acute diverticulitis.  [PC]  6294 Discussed case with hospitalist for admission and further workup and management.  [PC]    Clinical Course User Index [PC] Serine Kea, Grayce Sessions, MD     Final Clinical Impression(s) / ED Diagnoses Final diagnoses:  Fall      This chart was dictated using voice recognition software.  Despite best efforts to proofread,  errors can occur which can change the documentation meaning.   Fatima Blank, MD 12/10/16 2330

## 2016-12-10 NOTE — ED Notes (Signed)
Approached MD regarding blood pressure control. Pt continues to be hypertensive.

## 2016-12-10 NOTE — ED Triage Notes (Signed)
Pt to ER by [facility] after experiencing an unwitnessed fall today. Husband reports hearing a "yell" and running to the sun room finding her on the floor altered. At baseline patient said to be a/o x4, ambulatory, self sufficient, at this time pt is disoriented x3. Alert to self only. Moving all extremities. Pupils unequal but reactive, left +2, right +3. Incontinent of feces on arrival.

## 2016-12-10 NOTE — ED Notes (Signed)
Attempted to call report

## 2016-12-10 NOTE — Progress Notes (Signed)
1755--[facility] RN Visit  Met with patient and husband in the ER. Received report from bedside RN, Danelle Berry. Pt not responding to voice or touch at this time. Unable to talk with husband fully as pharmacy is in room discussing medications at this time. Medication list printed and given to Pharmacy.   Plan is for patient to be admitted and MRI later. Will continue to follow patient while in hospital after admission.  Pt is a DNR.  Please call for any hospice related questions or concerns.  Thank you, Margaretmary Eddy, RN, [facility] Liaison [phone]  [place] are on AMION.

## 2016-12-11 ENCOUNTER — Inpatient Hospital Stay (HOSPITAL_COMMUNITY)

## 2016-12-11 DIAGNOSIS — L8990 Pressure ulcer of unspecified site, unspecified stage: Secondary | ICD-10-CM

## 2016-12-11 DIAGNOSIS — G9341 Metabolic encephalopathy: Secondary | ICD-10-CM

## 2016-12-11 DIAGNOSIS — N390 Urinary tract infection, site not specified: Secondary | ICD-10-CM

## 2016-12-11 DIAGNOSIS — I10 Essential (primary) hypertension: Secondary | ICD-10-CM

## 2016-12-11 DIAGNOSIS — I714 Abdominal aortic aneurysm, without rupture: Secondary | ICD-10-CM

## 2016-12-11 DIAGNOSIS — G9340 Encephalopathy, unspecified: Secondary | ICD-10-CM

## 2016-12-11 LAB — CBC
HEMATOCRIT: 37 % (ref 36.0–46.0)
HEMOGLOBIN: 11.5 g/dL — AB (ref 12.0–15.0)
MCH: 28 pg (ref 26.0–34.0)
MCHC: 31.1 g/dL (ref 30.0–36.0)
MCV: 90 fL (ref 78.0–100.0)
Platelets: 132 10*3/uL — ABNORMAL LOW (ref 150–400)
RBC: 4.11 MIL/uL (ref 3.87–5.11)
RDW: 14.2 % (ref 11.5–15.5)
WBC: 6.8 10*3/uL (ref 4.0–10.5)

## 2016-12-11 LAB — BASIC METABOLIC PANEL
ANION GAP: 11 (ref 5–15)
BUN: 12 mg/dL (ref 6–20)
CALCIUM: 9.5 mg/dL (ref 8.9–10.3)
CHLORIDE: 98 mmol/L — AB (ref 101–111)
CO2: 29 mmol/L (ref 22–32)
CREATININE: 1.01 mg/dL — AB (ref 0.44–1.00)
GFR calc Af Amer: 58 mL/min — ABNORMAL LOW (ref >60)
GFR, EST NON AFRICAN AMERICAN: 50 mL/min — AB (ref >60)
GLUCOSE: 118 mg/dL — AB (ref 65–99)
Potassium: 4.2 mmol/L (ref 3.5–5.1)
Sodium: 138 mmol/L (ref 135–145)

## 2016-12-11 LAB — LACTIC ACID, PLASMA: Lactic Acid, Venous: 1.6 mmol/L (ref 0.5–1.9)

## 2016-12-11 MED ORDER — IPRATROPIUM-ALBUTEROL 0.5-2.5 (3) MG/3ML IN SOLN: 3.0000 mL | RESPIRATORY_TRACT | Status: DC | PRN

## 2016-12-11 MED ORDER — METOPROLOL TARTRATE 50 MG PO TABS
50.0000 mg | ORAL_TABLET | Freq: Every day | ORAL | Status: DC
  Administered 2016-12-11 – 2016-12-13 (×3): 50 mg via ORAL
  Filled 2016-12-11 (×3): qty 1

## 2016-12-11 MED ORDER — ORAL CARE MOUTH RINSE
15.0000 mL | Freq: Two times a day (BID) | OROMUCOSAL | Status: DC
  Administered 2016-12-11 – 2016-12-13 (×5): 15 mL via OROMUCOSAL

## 2016-12-11 MED ORDER — CEFTRIAXONE SODIUM 1 G IJ SOLR
1.0000 g | INTRAMUSCULAR | Status: DC
  Administered 2016-12-11 – 2016-12-12 (×2): 1 g via INTRAVENOUS
  Filled 2016-12-11 (×2): qty 10

## 2016-12-11 NOTE — Progress Notes (Signed)
PROGRESS NOTE [facility]   [name]   UTM:[id number] DOB: March 04, 1932  DOA: 12/10/2016 PCP: Jilda Panda, MD   Brief Narrative:  [name] is a 83 year old female with medical history of COPD, diabetes mellitus, AAA, lung cancer status post resection, dementia who was brought to the emergency department after a fall and found to have altered mental status.  Upon ED evaluation she was found to be confused, neurology was consulted and felt the story was suspicious for seizure-like activity, patient was loaded with fosphenytoin, and continue phenytoin 100 mg 3 times daily.  Patient was admitted with working diagnosis of UTI rule out CVA.  Subjective: Patient seen and examined, she is alert and following commands.  She reports that she has no complaints.  Denies chest pain, shortness of breath, palpitations and dizziness.   Assessment & Plan: Acute metabolic encephalopathy secondary to UTI -improved Suspected seizure -loaded with fosphenytoin and continued phenytoin 100 mg 3 times daily Neurology recommendations appreciated, requesting MRI w+wo contrast given hx of CA, limited MRI with no acute findings  EEG if no improvement  Treat UTI   UTI urine culture growing gram - rods  Continue Rocephin  Follow urine cx sensitivities   COPD/chronic hypercabia - stable no signs of exacerbation  Keep O2 supplement to keep O2 sat > 88%  Duoneb PRN   HTN  BP above goal  Metoprolol was held during admission will resume now  [place] BP   AAA On CT angiogram of the abdomen seems that the aneurysm has slightly increased.  Patient has symptomatic.  Will discuss findings with vascular surgery.   Fall  Likely due to encephalopathy and deconditioning  Treating underlying causes   ? AKI vs CKD  Patient was given IVF, will check BM in AM  No recent Cr baseline   DVT prophylaxis: Lovenox Code Status: DNR Family Communication: None at bedside Disposition Plan:  TBD   Consultants:   Neurology  Procedures:   None  Antimicrobials: Anti-infectives (From admission, onward)   Start     Dose/Rate Route Frequency Ordered Stop   12/11/16 1800  cefTRIAXone (ROCEPHIN) 1 g in dextrose 5 % 50 mL IVPB     1 g 100 mL/hr over 30 Minutes Intravenous Every 24 hours 12/11/16 0138     12/10/16 1800  cefTRIAXone (ROCEPHIN) 1 g in dextrose 5 % 50 mL IVPB     1 g 100 mL/hr over 30 Minutes Intravenous  Once 12/10/16 1749 12/10/16 1840        Objective: Vitals:   12/11/16 0451 12/11/16 0457 12/11/16 0919 12/11/16 0923  BP:  137/71  (!) 152/81  Pulse:  90  (!) 101  Resp:  14  19  Temp: 97.8 F (36.6 C)   97.7 F (36.5 C)  TempSrc: Oral  Oral Oral  SpO2:  100%  95%  Weight:      Height:        Intake/Output Summary (Last 24 hours) at 12/11/2016 1016 Last data filed at 12/11/2016 0400 Gross per 24 hour  Intake 1420 ml  Output -  Net 1420 ml   Filed Weights   12/10/16 2115 12/11/16 0444  Weight: 72 kg (158 lb 11.7 oz) 71.4 kg (157 lb 6.5 oz)    Examination:  General exam: Appears calm and comfortable  HEENT: PERRL, large bruise/laceration over left eyebrow, poor dentition, OP clear no LAD Respiratory system: Decreased breath sounds throughout, no wheezing or crackles, breathing comfortable, no use of accessory muscles  Cardiovascular system: S1 & S2 heard, RRR.  Positive systolic murmur  Gastrointestinal system: Abdomen is nondistended, soft and nontender. Normal bowel sounds heard. Central nervous system: Alert and oriented to person and place only. No focal neurological deficits.  Extremities: No lower extremity edema, strength 5/5   Skin: Multiple ecchymoses over extremities Psychiatry: Judgement and insight appear normal. Mood & affect appropriate.    Data Reviewed: I have personally reviewed following labs and imaging studies  CBC: Recent Labs  Lab 12/10/16 1415 12/10/16 1429 12/11/16 0153  WBC 5.8  --  6.8  NEUTROABS 3.5  --    --   HGB 11.4* 12.2 11.5*  HCT 37.0 36.0 37.0  MCV 90.0  --  90.0  PLT 132*  --  053*   Basic Metabolic Panel: Recent Labs  Lab 12/10/16 1415 12/10/16 1429 12/11/16 0153  NA 138 139 138  K 3.1* 3.1* 4.2  CL 96* 96* 98*  CO2 33*  --  29  GLUCOSE 161* 162* 118*  BUN 14 18 12   CREATININE 1.22* 1.20* 1.01*  CALCIUM 10.1  --  9.5   GFR: Estimated Creatinine Clearance: 40.7 mL/min (A) (by C-G formula based on SCr of 1.01 mg/dL (H)). Liver Function Tests: Recent Labs  Lab 12/10/16 1415  AST 33  ALT 25  ALKPHOS 54  BILITOT 1.1  PROT 6.5  ALBUMIN 3.8   No results for input(s): LIPASE, AMYLASE in the last 168 hours. Recent Labs  Lab 12/10/16 1551  AMMONIA 47*   Coagulation Profile: Recent Labs  Lab 12/10/16 1415  INR 1.05   Cardiac Enzymes: No results for input(s): CKTOTAL, CKMB, CKMBINDEX, TROPONINI in the last 168 hours. BNP (last 3 results) No results for input(s): PROBNP in the last 8760 hours. HbA1C: No results for input(s): HGBA1C in the last 72 hours. CBG: No results for input(s): GLUCAP in the last 168 hours. Lipid Profile: No results for input(s): CHOL, HDL, LDLCALC, TRIG, CHOLHDL, LDLDIRECT in the last 72 hours. Thyroid Function Tests: Recent Labs    12/10/16 2221  TSH 0.204*   Anemia Panel: No results for input(s): VITAMINB12, FOLATE, FERRITIN, TIBC, IRON, RETICCTPCT in the last 72 hours. Sepsis Labs: Recent Labs  Lab 12/10/16 1429 12/10/16 1840 12/10/16 2221 12/11/16 0153  LATICACIDVEN 2.30* 2.47* 1.8 1.6    Recent Results (from the past 240 hour(s))  MRSA PCR Screening     Status: None   Collection Time: 12/10/16  9:11 PM  Result Value Ref Range Status   MRSA by PCR NEGATIVE NEGATIVE Final    Comment:        The GeneXpert MRSA Assay (FDA approved for NASAL specimens only), is one component of a comprehensive MRSA colonization surveillance program. It is not intended to diagnose MRSA infection nor to guide or monitor treatment  for MRSA infections.       Radiology Studies: Dg Chest 1 View  Result Date: 12/10/2016 CLINICAL DATA:  Patient status post fall. Altered mental status. Right hip pain. EXAM: CHEST 1 VIEW COMPARISON:  Chest CT 11/10/2014. FINDINGS: Patient is rotated to the left. Cardiomegaly. Aortic atherosclerosis. Multiple monitoring leads overlie the patient. No large area of pulmonary consolidation. No pleural effusion or pneumothorax. IMPRESSION: Limited rotated portable exam. Cardiomegaly. No large area pulmonary consolidation. Electronically Signed   By: Lovey Newcomer M.D.   On: 12/10/2016 15:05   Ct Head Wo Contrast  Result Date: 12/10/2016 CLINICAL DATA:  Initial evaluation for acute trauma, fall. EXAM: CT HEAD WITHOUT CONTRAST CT CERVICAL  SPINE WITHOUT CONTRAST TECHNIQUE: Multidetector CT imaging of the head and cervical spine was performed following the standard protocol without intravenous contrast. Multiplanar CT image reconstructions of the cervical spine were also generated. COMPARISON:  Prior CT from 12/14/2007. FINDINGS: CT HEAD FINDINGS Brain: Moderate cerebral atrophy with chronic small vessel ischemic disease. No acute intracranial hemorrhage. No acute large vessel territory infarct. No mass lesion, midline shift or mass effect. No hydrocephalus. No extra-axial fluid collection. Vascular: No hyperdense vessel. Scattered vascular calcifications noted within the carotid siphons. Skull: CED soft tissue contusions present at the left forehead and left periorbital region. Calvarium intact. Sinuses/Orbits: Globes and orbital soft tissues demonstrate no acute abnormality. Postsurgical changes from prior scleral banding present on the left. Patient status post cataract extraction bilaterally. Axial myopia noted. Minimal mucosal thickening noted within the ethmoidal air cells. Paranasal sinuses are otherwise clear. No mastoid effusion. Other: None. CT CERVICAL SPINE FINDINGS Alignment:  Study degraded by motion  artifact. Vertebral bodies normally aligned with preservation of the normal cervical lordosis. No listhesis. Skull base and vertebrae: Skullbase intact. Mild rotation of C1 on C2 likely positional. Dens intact. Vertebral body heights maintained. No acute fracture. Soft tissues and spinal canal: Soft tissues of the neck demonstrate no acute abnormality. No prevertebral edema. Vascular calcifications present about the carotid bifurcations. Scattered calcifications with subcentimeter hypodense nodules noted within the thyroid, of doubtful significance. Disc levels: Mild to moderate degenerate spondylolysis at C5-6 and C6-7. Upper chest: Visualized upper chest demonstrates no acute abnormality. Visualized lung apices are clear. Other: None. IMPRESSION: CT BRAIN: 1. No acute intracranial process. 2. Soft tissue contusions at the left frontal scalp and left periorbital region. Calvarium intact. 3. Moderate cerebral atrophy with chronic small vessel ischemic disease. CT CERVICAL SPINE: 1. No acute traumatic injury within cervical spine. 2. Mild-to-moderate degenerative spondylolysis at C5-6 and C6-7. Electronically Signed   By: Jeannine Boga M.D.   On: 12/10/2016 16:11   Ct Cervical Spine Wo Contrast  Result Date: 12/10/2016 CLINICAL DATA:  Initial evaluation for acute trauma, fall. EXAM: CT HEAD WITHOUT CONTRAST CT CERVICAL SPINE WITHOUT CONTRAST TECHNIQUE: Multidetector CT imaging of the head and cervical spine was performed following the standard protocol without intravenous contrast. Multiplanar CT image reconstructions of the cervical spine were also generated. COMPARISON:  Prior CT from 12/14/2007. FINDINGS: CT HEAD FINDINGS Brain: Moderate cerebral atrophy with chronic small vessel ischemic disease. No acute intracranial hemorrhage. No acute large vessel territory infarct. No mass lesion, midline shift or mass effect. No hydrocephalus. No extra-axial fluid collection. Vascular: No hyperdense vessel.  Scattered vascular calcifications noted within the carotid siphons. Skull: CED soft tissue contusions present at the left forehead and left periorbital region. Calvarium intact. Sinuses/Orbits: Globes and orbital soft tissues demonstrate no acute abnormality. Postsurgical changes from prior scleral banding present on the left. Patient status post cataract extraction bilaterally. Axial myopia noted. Minimal mucosal thickening noted within the ethmoidal air cells. Paranasal sinuses are otherwise clear. No mastoid effusion. Other: None. CT CERVICAL SPINE FINDINGS Alignment:  Study degraded by motion artifact. Vertebral bodies normally aligned with preservation of the normal cervical lordosis. No listhesis. Skull base and vertebrae: Skullbase intact. Mild rotation of C1 on C2 likely positional. Dens intact. Vertebral body heights maintained. No acute fracture. Soft tissues and spinal canal: Soft tissues of the neck demonstrate no acute abnormality. No prevertebral edema. Vascular calcifications present about the carotid bifurcations. Scattered calcifications with subcentimeter hypodense nodules noted within the thyroid, of doubtful significance. Disc levels: Mild to  moderate degenerate spondylolysis at C5-6 and C6-7. Upper chest: Visualized upper chest demonstrates no acute abnormality. Visualized lung apices are clear. Other: None. IMPRESSION: CT BRAIN: 1. No acute intracranial process. 2. Soft tissue contusions at the left frontal scalp and left periorbital region. Calvarium intact. 3. Moderate cerebral atrophy with chronic small vessel ischemic disease. CT CERVICAL SPINE: 1. No acute traumatic injury within cervical spine. 2. Mild-to-moderate degenerative spondylolysis at C5-6 and C6-7. Electronically Signed   By: Jeannine Boga M.D.   On: 12/10/2016 16:11   Dg Knee Complete 4 Views Left  Result Date: 12/10/2016 CLINICAL DATA:  Patient status post fall. Knee pain. Initial encounter. EXAM: LEFT KNEE -  COMPLETE 4+ VIEW COMPARISON:  None. FINDINGS: Normal anatomic alignment. Tricompartmental osteophytosis. Chondrocalcinosis. No evidence for acute fracture dislocation. Regional soft tissues are unremarkable. No joint effusion. IMPRESSION: No acute osseous abnormality. Degenerative changes. Electronically Signed   By: Lovey Newcomer M.D.   On: 12/10/2016 15:07   Mr Brain Limited Wo Contrast  Result Date: 12/11/2016 CLINICAL DATA:  Altered level of consciousness. Fall with LEFT orbital bruising, weakness. EXAM: MRI HEAD WITHOUT CONTRAST TECHNIQUE: Axial and coronal diffusion weighted imaging obtained on a 1.5 tesla scanner. Patient terminated the examination by climbing out of the scanner. COMPARISON:  CT HEAD December 10, 2016 FINDINGS: BRAIN: No reduced diffusion to suggest acute ischemia, hypercellular tumor, typical infection. No midline shift or mass effect. No hydrocephalus. VASCULAR: Nondiagnostic. SKULL AND UPPER CERVICAL SPINE: Nondiagnostic. SINUSES/ORBITS: Nondiagnostic. OTHER: None. IMPRESSION: Limited 2 sequence MRI of the head without acute intracranial process. Electronically Signed   By: Elon Alas M.D.   On: 12/11/2016 04:40   Ct Angio Chest/abd/pel For Dissection W And/or Wo Contrast  Result Date: 12/10/2016 CLINICAL DATA:  Patient with history of abdominal aortic aneurysm. Recent fall. History of lung cancer. Vomiting. EXAM: CT ANGIOGRAPHY CHEST, ABDOMEN AND PELVIS TECHNIQUE: Multidetector CT imaging through the chest, abdomen and pelvis was performed using the standard protocol during bolus administration of intravenous contrast. Multiplanar reconstructed images and MIPs were obtained and reviewed to evaluate the vascular anatomy. CONTRAST:  80 cc Isovue 370 COMPARISON:  CT chest 11/10/2014. FINDINGS: CTA CHEST FINDINGS Cardiovascular: Normal heart size. Coronary arterial vascular calcifications. Thoracic aortic vascular calcifications. No peripheral high attenuation within the thoracic  aorta to suggest acute intramural hematoma. Mediastinum/Nodes: No enlarged axillary, mediastinal or hilar lymphadenopathy. Small hiatal hernia. Lungs/Pleura: Central airways are patent. Slight interval increase in right paramediastinal soft tissue measuring up to 2 cm (image 44; series 7), previously 1.3 cm. Subpleural airspace opacities within the medial right lower lobe (image 45; series 2). New 11 mm ground-glass nodule within the right lower lobe (image 69; series 7). New 4 mm right lower lobe nodule (image 57; series 7). No pleural effusion or pneumothorax. Musculoskeletal: Thoracic spine degenerative changes. No aggressive or acute appearing osseous lesions. Review of the MIP images confirms the above findings. CTA ABDOMEN AND PELVIS FINDINGS VASCULAR Aorta: Bilobed abdominal aortic aneurysm originating at the level of the celiac axis and the left renal artery and extending to the distal abdominal aorta. Aneurysm sac at the level of the origin of the left renal artery measures 5.0 x 5.0 cm (image 98; series 6). This is increased from prior exam where it measured 4.3 x 4.8 cm. The inferior aspect of the bilobed aneurysm measures 4.5 x 4.6 cm, previously 2.8 x 3.0 cm. There is nonspecific fat stranding about the abdominal aortic aneurysm. Celiac: Extensive atherosclerotic narrowing at the origin and  along the course of the celiac axis. SMA: Patent however narrowed at the origin with calcified atherosclerotic plaque. Renals: The right renal artery is patent however there is focal saccular aneurysm at the origin of the right renal artery which measures 1.4 cm (image 76; series 9). The left renal artery is patent. There is focal saccular outpouching at the level of the origin measuring 2.2 cm (image 82; series 9). IMA: Patent Veins: Unremarkable Review of the MIP images confirms the above findings. NON-VASCULAR Hepatobiliary: The liver is normal in size and contour. No focal hepatic lesion is identified. No  intrahepatic or extrahepatic biliary ductal dilatation. Pancreas: Unchanged 2.2 cm low-attenuation lesion within the pancreatic body (image 92; series 6). Spleen: Unremarkable Adrenals/Urinary Tract: Re- demonstrated 4.2 x 2.9 cm left adrenal nodule. Right adrenal gland is normal. Kidneys enhance symmetrically with contrast. No hydronephrosis. There is a 2.3 cm partially exophytic cyst off the inferior pole the left kidney. Stomach/Bowel: Descending and sigmoid colonic diverticulosis. Mild fat stranding about the sigmoid colon. No evidence for bowel obstruction. No free fluid or free intraperitoneal air. Normal morphology of the stomach. Lymphatic: Multiple prominent aortocaval lymph nodes are demonstrated measuring up to 1.5 cm (image 116; series 6), previously 1.5 cm. No new or progressive retroperitoneal adenopathy. Reproductive: Uterus and adnexal structures are unremarkable. Other: None. Musculoskeletal: Lumbar spine degenerative changes. No aggressive or acute appearing osseous lesions. Review of the MIP images confirms the above findings. IMPRESSION: 1. There is a bilobed abdominal aortic aneurysm which originates at the level of the celiac axis and extends caudally to near the bifurcation. There is fat stranding about the aneurysm sac which is nonspecific in etiology. Given the fat stranding about the aortic aneurysm sac, impending rupture cannot be excluded. 2. Slight interval progression of airspace opacity within the medial right lung and right perihilar location. Recommend attention on follow-up to exclude the possibility of disease progression 3. Mild fat stranding about the sigmoid colon raising the possibility of diverticulitis. 4. There a few small pulmonary nodules within the right lung as detailed above. Recommend attention on follow-up chest CT. 5. Re- demonstrated pancreatic and adrenal lesions, likely benign in etiology, recommend attention on follow-up. 6. Unchanged T6 wedge compression  deformity. 7. Critical Value/emergent results were called by telephone at the time of interpretation on 12/10/2016 at 6:03 pm to Dr. Addison Lank , who verbally acknowledged these results. Electronically Signed   By: Lovey Newcomer M.D.   On: 12/10/2016 18:15   Dg Hip Unilat With Pelvis 2-3 Views Right  Result Date: 12/10/2016 CLINICAL DATA:  Right hip pain following a fall today. EXAM: DG HIP (WITH OR WITHOUT PELVIS) 2-3V RIGHT COMPARISON:  Abdomen and pelvis CT dated 04/17/2008. FINDINGS: Diffuse osteopenia. No fracture or dislocation seen. Lower lumbar spine degenerative changes. Atheromatous arterial calcifications with a distal abdominal aortic aneurysm measuring 5.2 cm in maximum transverse diameter without correction for magnification. This measured 4.5 cm in maximum diameter 04/17/2008. IMPRESSION: 1. Infrarenal abdominal aortic aneurysm measuring 5.2 cm in maximum transverse diameter without correction for magnification. This measured 4.5 cm on 04/17/2008. Further evaluation with an abdomen and pelvis CT with contrast is recommended. 2. No hip fracture or dislocation seen. Electronically Signed   By: Claudie Revering M.D.   On: 12/10/2016 15:11      Scheduled Meds: . aspirin EC  81 mg Oral Daily  . enoxaparin (LOVENOX) injection  40 mg Subcutaneous Q24H  . mouth rinse  15 mL Mouth Rinse BID  . phenytoin (  DILANTIN) IV  100 mg Intravenous TID  . sodium chloride flush  3 mL Intravenous Q12H   Continuous Infusions: . cefTRIAXone (ROCEPHIN)  IV       LOS: 1 day    Time spent: Total of 35 minutes spent with pt, greater than 50% of which was spent in discussion of  treatment, counseling and coordination of care    Chipper Oman, MD Pager: Text Page via [email or web address]   If 7PM-7AM, please contact night-coverage [email or web address] 12/11/2016, 10:16 AM

## 2016-12-11 NOTE — Progress Notes (Addendum)
Subjective: Patient is much more alert and oriented today.  She was able to repeat things such as practitioners names and follow commands.  Patient is looking much better today and has no complaints.  Exam: Vitals:   12/11/16 0457 12/11/16 0923  BP: 137/71 (!) 152/81  Pulse: 90 (!) 101  Resp: 14 19  Temp:  97.7 F (36.5 C)  SpO2: 100% 95%    HEENT-  Normocephalic, no lesions, without obvious abnormality.  Normal external eye and conjunctiva.  Normal TM's bilaterally.  Normal auditory canals and external ears. Normal external nose, mucus membranes and septum.  Normal pharynx. Cardiovascular- S1, S2 normal, pulses palpable throughout   Lungs- chest clear, no wheezing, rales, normal symmetric air entry, Heart exam - S1, S2 normal, no murmur, no gallop, rate regular    Neuro:  CN: Pupils are equal and round. They are symmetrically reactive from 3-->2 mm. EOMI without nystagmus. Facial sensation is intact to light touch.  Positive left facial droop.  Hearing is intact to conversational voice. Palate elevates symmetrically and uvula is midline. Voice is normal in tone, pitch and quality. Bilateral SCM and trapezii are 5/5. Tongue is midline with normal bulk and mobility.  Motor:  Gravity with a 4/5 strength.  Patient has a weaker left upper and lower extremity with a 3/5 strength. Sensation: Intact to light touch.  DTRs: 2+, symmetric  Toes downgoing bilaterally. No pathologic reflexes.    Medications:  Scheduled: . aspirin EC  81 mg Oral Daily  . enoxaparin (LOVENOX) injection  40 mg Subcutaneous Q24H  . mouth rinse  15 mL Mouth Rinse BID  . phenytoin (DILANTIN) IV  100 mg Intravenous TID  . sodium chloride flush  3 mL Intravenous Q12H   Continuous: . sodium chloride 75 mL/hr at 12/10/16 2224  . cefTRIAXone (ROCEPHIN)  IV      Pertinent Labs/Diagnostics:   Dg Chest 1 View  Result Date: 12/10/2016 CLINICAL DATA:  Patient status post fall. Altered mental status. Right hip pain.  EXAM: CHEST 1 VIEW COMPARISON:  Chest CT 11/10/2014. FINDINGS: Patient is rotated to the left. Cardiomegaly. Aortic atherosclerosis. Multiple monitoring leads overlie the patient. No large area of pulmonary consolidation. No pleural effusion or pneumothorax. IMPRESSION: Limited rotated portable exam. Cardiomegaly. No large area pulmonary consolidation. Electronically Signed   By: Lovey Newcomer M.D.   On: 12/10/2016 15:05   Ct Head Wo Contrast  Result Date: 12/10/2016 CLINICAL DATA:  Initial evaluation for acute trauma, fall. EXAM: CT HEAD WITHOUT CONTRAST CT CERVICAL SPINE WITHOUT CONTRAST TECHNIQUE: Multidetector CT imaging of the head and cervical spine was performed following the standard protocol without intravenous contrast. Multiplanar CT image reconstructions of the cervical spine were also generated. COMPARISON:  Prior CT from 12/14/2007. FINDINGS: CT HEAD FINDINGS Brain: Moderate cerebral atrophy with chronic small vessel ischemic disease. No acute intracranial hemorrhage. No acute large vessel territory infarct. No mass lesion, midline shift or mass effect. No hydrocephalus. No extra-axial fluid collection. Vascular: No hyperdense vessel. Scattered vascular calcifications noted within the carotid siphons. Skull: CED soft tissue contusions present at the left forehead and left periorbital region. Calvarium intact. Sinuses/Orbits: Globes and orbital soft tissues demonstrate no acute abnormality. Postsurgical changes from prior scleral banding present on the left. Patient status post cataract extraction bilaterally. Axial myopia noted. Minimal mucosal thickening noted within the ethmoidal air cells. Paranasal sinuses are otherwise clear. No mastoid effusion. Other: None. CT CERVICAL SPINE FINDINGS Alignment:  Study degraded by motion artifact. Vertebral bodies normally  aligned with preservation of the normal cervical lordosis. No listhesis. Skull base and vertebrae: Skullbase intact. Mild rotation of C1 on  C2 likely positional. Dens intact. Vertebral body heights maintained. No acute fracture. Soft tissues and spinal canal: Soft tissues of the neck demonstrate no acute abnormality. No prevertebral edema. Vascular calcifications present about the carotid bifurcations. Scattered calcifications with subcentimeter hypodense nodules noted within the thyroid, of doubtful significance. Disc levels: Mild to moderate degenerate spondylolysis at C5-6 and C6-7. Upper chest: Visualized upper chest demonstrates no acute abnormality. Visualized lung apices are clear. Other: None. IMPRESSION: CT BRAIN: 1. No acute intracranial process. 2. Soft tissue contusions at the left frontal scalp and left periorbital region. Calvarium intact. 3. Moderate cerebral atrophy with chronic small vessel ischemic disease. CT CERVICAL SPINE: 1. No acute traumatic injury within cervical spine. 2. Mild-to-moderate degenerative spondylolysis at C5-6 and C6-7. Electronically Signed   By: Jeannine Boga M.D.   On: 12/10/2016 16:11   Ct Cervical Spine Wo Contrast  Result Date: 12/10/2016 CLINICAL DATA:  Initial evaluation for acute trauma, fall. EXAM: CT HEAD WITHOUT CONTRAST CT CERVICAL SPINE WITHOUT CONTRAST TECHNIQUE: Multidetector CT imaging of the head and cervical spine was performed following the standard protocol without intravenous contrast. Multiplanar CT image reconstructions of the cervical spine were also generated. COMPARISON:  Prior CT from 12/14/2007. FINDINGS: CT HEAD FINDINGS Brain: Moderate cerebral atrophy with chronic small vessel ischemic disease. No acute intracranial hemorrhage. No acute large vessel territory infarct. No mass lesion, midline shift or mass effect. No hydrocephalus. No extra-axial fluid collection. Vascular: No hyperdense vessel. Scattered vascular calcifications noted within the carotid siphons. Skull: CED soft tissue contusions present at the left forehead and left periorbital region. Calvarium intact.  Sinuses/Orbits: Globes and orbital soft tissues demonstrate no acute abnormality. Postsurgical changes from prior scleral banding present on the left. Patient status post cataract extraction bilaterally. Axial myopia noted. Minimal mucosal thickening noted within the ethmoidal air cells. Paranasal sinuses are otherwise clear. No mastoid effusion. Other: None. CT CERVICAL SPINE FINDINGS Alignment:  Study degraded by motion artifact. Vertebral bodies normally aligned with preservation of the normal cervical lordosis. No listhesis. Skull base and vertebrae: Skullbase intact. Mild rotation of C1 on C2 likely positional. Dens intact. Vertebral body heights maintained. No acute fracture. Soft tissues and spinal canal: Soft tissues of the neck demonstrate no acute abnormality. No prevertebral edema. Vascular calcifications present about the carotid bifurcations. Scattered calcifications with subcentimeter hypodense nodules noted within the thyroid, of doubtful significance. Disc levels: Mild to moderate degenerate spondylolysis at C5-6 and C6-7. Upper chest: Visualized upper chest demonstrates no acute abnormality. Visualized lung apices are clear. Other: None. IMPRESSION: CT BRAIN: 1. No acute intracranial process. 2. Soft tissue contusions at the left frontal scalp and left periorbital region. Calvarium intact. 3. Moderate cerebral atrophy with chronic small vessel ischemic disease. CT CERVICAL SPINE: 1. No acute traumatic injury within cervical spine. 2. Mild-to-moderate degenerative spondylolysis at C5-6 and C6-7. Electronically Signed   By: Jeannine Boga M.D.   On: 12/10/2016 16:11   Dg Knee Complete 4 Views Left  Result Date: 12/10/2016 CLINICAL DATA:  Patient status post fall. Knee pain. Initial encounter. EXAM: LEFT KNEE - COMPLETE 4+ VIEW COMPARISON:  None. FINDINGS: Normal anatomic alignment. Tricompartmental osteophytosis. Chondrocalcinosis. No evidence for acute fracture dislocation. Regional soft  tissues are unremarkable. No joint effusion. IMPRESSION: No acute osseous abnormality. Degenerative changes. Electronically Signed   By: Lovey Newcomer M.D.   On: 12/10/2016 15:07  Mr Brain Limited Wo Contrast  Result Date: 12/11/2016 CLINICAL DATA:  Altered level of consciousness. Fall with LEFT orbital bruising, weakness. EXAM: MRI HEAD WITHOUT CONTRAST TECHNIQUE: Axial and coronal diffusion weighted imaging obtained on a 1.5 tesla scanner. Patient terminated the examination by climbing out of the scanner. COMPARISON:  CT HEAD December 10, 2016 FINDINGS: BRAIN: No reduced diffusion to suggest acute ischemia, hypercellular tumor, typical infection. No midline shift or mass effect. No hydrocephalus. VASCULAR: Nondiagnostic. SKULL AND UPPER CERVICAL SPINE: Nondiagnostic. SINUSES/ORBITS: Nondiagnostic. OTHER: None. IMPRESSION: Limited 2 sequence MRI of the head without acute intracranial process. Electronically Signed   By: Elon Alas M.D.   On: 12/11/2016 04:40   Ct Angio Chest/abd/pel For Dissection W And/or Wo Contrast  Result Date: 12/10/2016 CLINICAL DATA:  Patient with history of abdominal aortic aneurysm. Recent fall. History of lung cancer. Vomiting. EXAM: CT ANGIOGRAPHY CHEST, ABDOMEN AND PELVIS TECHNIQUE: Multidetector CT imaging through the chest, abdomen and pelvis was performed using the standard protocol during bolus administration of intravenous contrast. Multiplanar reconstructed images and MIPs were obtained and reviewed to evaluate the vascular anatomy. CONTRAST:  80 cc Isovue 370 COMPARISON:  CT chest 11/10/2014. FINDINGS: CTA CHEST FINDINGS Cardiovascular: Normal heart size. Coronary arterial vascular calcifications. Thoracic aortic vascular calcifications. No peripheral high attenuation within the thoracic aorta to suggest acute intramural hematoma. Mediastinum/Nodes: No enlarged axillary, mediastinal or hilar lymphadenopathy. Small hiatal hernia. Lungs/Pleura: Central airways are  patent. Slight interval increase in right paramediastinal soft tissue measuring up to 2 cm (image 44; series 7), previously 1.3 cm. Subpleural airspace opacities within the medial right lower lobe (image 45; series 2). New 11 mm ground-glass nodule within the right lower lobe (image 69; series 7). New 4 mm right lower lobe nodule (image 57; series 7). No pleural effusion or pneumothorax. Musculoskeletal: Thoracic spine degenerative changes. No aggressive or acute appearing osseous lesions. Review of the MIP images confirms the above findings. CTA ABDOMEN AND PELVIS FINDINGS VASCULAR Aorta: Bilobed abdominal aortic aneurysm originating at the level of the celiac axis and the left renal artery and extending to the distal abdominal aorta. Aneurysm sac at the level of the origin of the left renal artery measures 5.0 x 5.0 cm (image 98; series 6). This is increased from prior exam where it measured 4.3 x 4.8 cm. The inferior aspect of the bilobed aneurysm measures 4.5 x 4.6 cm, previously 2.8 x 3.0 cm. There is nonspecific fat stranding about the abdominal aortic aneurysm. Celiac: Extensive atherosclerotic narrowing at the origin and along the course of the celiac axis. SMA: Patent however narrowed at the origin with calcified atherosclerotic plaque. Renals: The right renal artery is patent however there is focal saccular aneurysm at the origin of the right renal artery which measures 1.4 cm (image 76; series 9). The left renal artery is patent. There is focal saccular outpouching at the level of the origin measuring 2.2 cm (image 82; series 9). IMA: Patent Veins: Unremarkable Review of the MIP images confirms the above findings. NON-VASCULAR Hepatobiliary: The liver is normal in size and contour. No focal hepatic lesion is identified. No intrahepatic or extrahepatic biliary ductal dilatation. Pancreas: Unchanged 2.2 cm low-attenuation lesion within the pancreatic body (image 92; series 6). Spleen: Unremarkable  Adrenals/Urinary Tract: Re- demonstrated 4.2 x 2.9 cm left adrenal nodule. Right adrenal gland is normal. Kidneys enhance symmetrically with contrast. No hydronephrosis. There is a 2.3 cm partially exophytic cyst off the inferior pole the left kidney. Stomach/Bowel: Descending and sigmoid colonic  diverticulosis. Mild fat stranding about the sigmoid colon. No evidence for bowel obstruction. No free fluid or free intraperitoneal air. Normal morphology of the stomach. Lymphatic: Multiple prominent aortocaval lymph nodes are demonstrated measuring up to 1.5 cm (image 116; series 6), previously 1.5 cm. No new or progressive retroperitoneal adenopathy. Reproductive: Uterus and adnexal structures are unremarkable. Other: None. Musculoskeletal: Lumbar spine degenerative changes. No aggressive or acute appearing osseous lesions. Review of the MIP images confirms the above findings. IMPRESSION: 1. There is a bilobed abdominal aortic aneurysm which originates at the level of the celiac axis and extends caudally to near the bifurcation. There is fat stranding about the aneurysm sac which is nonspecific in etiology. Given the fat stranding about the aortic aneurysm sac, impending rupture cannot be excluded. 2. Slight interval progression of airspace opacity within the medial right lung and right perihilar location. Recommend attention on follow-up to exclude the possibility of disease progression 3. Mild fat stranding about the sigmoid colon raising the possibility of diverticulitis. 4. There a few small pulmonary nodules within the right lung as detailed above. Recommend attention on follow-up chest CT. 5. Re- demonstrated pancreatic and adrenal lesions, likely benign in etiology, recommend attention on follow-up. 6. Unchanged T6 wedge compression deformity. 7. Critical Value/emergent results were called by telephone at the time of interpretation on 12/10/2016 at 6:03 pm to Dr. Addison Lank , who verbally acknowledged these  results. Electronically Signed   By: Lovey Newcomer M.D.   On: 12/10/2016 18:15   Dg Hip Unilat With Pelvis 2-3 Views Right  Result Date: 12/10/2016 CLINICAL DATA:  Right hip pain following a fall today. EXAM: DG HIP (WITH OR WITHOUT PELVIS) 2-3V RIGHT COMPARISON:  Abdomen and pelvis CT dated 04/17/2008. FINDINGS: Diffuse osteopenia. No fracture or dislocation seen. Lower lumbar spine degenerative changes. Atheromatous arterial calcifications with a distal abdominal aortic aneurysm measuring 5.2 cm in maximum transverse diameter without correction for magnification. This measured 4.5 cm in maximum diameter 04/17/2008. IMPRESSION: 1. Infrarenal abdominal aortic aneurysm measuring 5.2 cm in maximum transverse diameter without correction for magnification. This measured 4.5 cm on 04/17/2008. Further evaluation with an abdomen and pelvis CT with contrast is recommended. 2. No hip fracture or dislocation seen. Electronically Signed   By: Claudie Revering M.D.   On: 12/10/2016 15:11     Etta Quill PA-C [facility] [phone]  12/11/2016, 9:46 AM  Attending Neurohospitalist Addendum Patient seen and examined. Agree with the history and physical as documented above. Agree with the plan as documented, which I helped formulate. I have independently obtaining history, and review of systems on my exam and the patient and reviewed the chart including pertinent labs and pertinent imaging.   Assessment:  83 year old woman with a past history of AAA, COPD, diabetes, endometrial cyst, lung cancer status post resection, rheumatoid arthritis and dementia has been cognitive declining over the past few months to years was noted to be altered after having a fall.  Husband found her down on the ground after hearing a yell, followed by her becoming altered followed by some improvement in her mentation followed by again being altered. This episode is suspicious for seizure-like activity. She has no known  history of seizures.  She does have a history of a malignancy. I would presumptively treat her for seizures.  On follow-up she has had no further seizures overnight.    Impression: Evaluate for seizures Evaluate for structural abnormalities of the brain such as strokes or brain metastases Toxic metabolic encephalopathy Dementia  Recommendations: Loaded with fosphenytoin 1 g x1. Following that continue with phenytoin 100 mg 3 times daily Maintain seizure precautions Obtain an MRI w+w/o when possible - Call us if any questions arise after MRI results. Continue with treating toxic metabolic derangements as well as infections as you are. Consider palliative consult to clarify goals of care. Neurology will be available as needed.  Please call us with questions.  --- Amie Portland, MD [facility] [phone]  If 7pm to 7am, please call on call as listed on AMION.

## 2016-12-11 NOTE — Progress Notes (Signed)
Pharmacy Antibiotic Note  [name] is a 83 y.o. female admitted on 12/10/2016 with UTI.  Pharmacy has been consulted for Rocephin dosing.  Plan: Rocephin 1g IV Q24H.  Pharmacy will sign off.  Height: 5\' 3"  (160 cm) Weight: 158 lb 11.7 oz (72 kg) IBW/kg (Calculated) : 52.4  Temp (24hrs), Avg:97.3 F (36.3 C), Min:96.7 F (35.9 C), Max:97.7 F (36.5 C)   Recent Labs Lab 12/10/16 1415 12/10/16 1429 12/10/16 1840 12/10/16 2221  WBC 5.8  --   --   --   CREATININE 1.22* 1.20*  --   --   LATICACIDVEN  --  2.30* 2.47* 1.8    Estimated Creatinine Clearance: 34.4 mL/min (A) (by C-G formula based on SCr of 1.2 mg/dL (H)).    No Known Allergies   Thank you for allowing pharmacy to be a part of this patient's care.  Wynona Neat, PharmD, BCPS  12/11/2016 1:37 AM

## 2016-12-11 NOTE — Progress Notes (Addendum)
[facility] GIP RN visit at 1115 am  This is a related and covered GIP admission of 12/10/16 with [facility] diagnosis of COPD, per Dr. Earlie Counts. Patient has [facility] DNR, though it did not follow patient during transport with [facility]. Spouse activated EMS after a fall in the home. He heard patient yell out and heard her fall and found her in the floor. Spouse reports he believes she may have stood up to go to the bathroom and wheelchair was pushed out from under her. Hospice was called but spouse did not wait for phone call to be returned. Patient was admitted to hospital for acute encephalopathy.  Visited with patient and husband in room this morning. Patient is alert and oriented to self only. Husband reports that she appears to be back to her baseline as she is at home. She denies any pain at this time.   Patient has large bruise/laceration over left eyebrow with dried blood. Large bruise on left knee. She is moving all extremities and fidgeting, looking at things in her hands that are not there and pointing randomly, she denies she is pointing at anything.  Pt has Purewick catheter draining cloudy yellow urine. She has a PIV in the left antecubital that is saline locked. PIV in right hand infusing NS at 75 cc/hr (per Denyse Amass, bedside RN). Continuous medications include cefTRIAXone (ROCEPHIN) 1 g in dextrose 5 % 50 mL IVPB every 24 hours. She has received HYDROcodone-acetaminophen (NORCO/VICODIN) 5-325 MG 1 tablet since admission. Pt was loaded with fosphenytoin and now on phenytoin (DILANTIN) injection 100 mg TID as well as enoxaparin (LOVENOX) injection 40 mg Q 24 hours.  CT and MRI completed and without acute intracranial process.  Pt is currently NPO.   Transfer summary and current [facility] medication list placed on shadow chart.  We will continue to follow while in the hospital and anticipate any discharge needs.  Please call with any hospice related questions  or concerns.  Thank you, Margaretmary Eddy, RN, [facility]ia[facility] [phone]  [facility] are on AMION.

## 2016-12-12 ENCOUNTER — Other Ambulatory Visit: Payer: Self-pay

## 2016-12-12 DIAGNOSIS — W19XXXD Unspecified fall, subsequent encounter: Secondary | ICD-10-CM

## 2016-12-12 DIAGNOSIS — J449 Chronic obstructive pulmonary disease, unspecified: Secondary | ICD-10-CM

## 2016-12-12 DIAGNOSIS — N179 Acute kidney failure, unspecified: Principal | ICD-10-CM

## 2016-12-12 LAB — BASIC METABOLIC PANEL
ANION GAP: 12 (ref 5–15)
BUN: 9 mg/dL (ref 6–20)
CALCIUM: 9.9 mg/dL (ref 8.9–10.3)
CO2: 30 mmol/L (ref 22–32)
CREATININE: 0.8 mg/dL (ref 0.44–1.00)
Chloride: 97 mmol/L — ABNORMAL LOW (ref 101–111)
GFR calc Af Amer: >60 mL/min (ref >60)
GLUCOSE: 114 mg/dL — AB (ref 65–99)
Potassium: 4 mmol/L (ref 3.5–5.1)
Sodium: 139 mmol/L (ref 135–145)

## 2016-12-12 LAB — CBC WITH DIFFERENTIAL/PLATELET
BASOS ABS: 0 10*3/uL (ref 0.0–0.1)
BASOS PCT: 0 %
EOS ABS: 0.1 10*3/uL (ref 0.0–0.7)
EOS PCT: 1 %
HCT: 38.8 % (ref 36.0–46.0)
Hemoglobin: 11.9 g/dL — ABNORMAL LOW (ref 12.0–15.0)
Lymphocytes Relative: 14 %
Lymphs Abs: 1.2 10*3/uL (ref 0.7–4.0)
MCH: 27.5 pg (ref 26.0–34.0)
MCHC: 30.7 g/dL (ref 30.0–36.0)
MCV: 89.6 fL (ref 78.0–100.0)
MONO ABS: 1.2 10*3/uL — AB (ref 0.1–1.0)
MONOS PCT: 14 %
NEUTROS ABS: 6.1 10*3/uL (ref 1.7–7.7)
Neutrophils Relative %: 71 %
PLATELETS: 143 10*3/uL — AB (ref 150–400)
RBC: 4.33 MIL/uL (ref 3.87–5.11)
RDW: 14.4 % (ref 11.5–15.5)
WBC: 8.5 10*3/uL (ref 4.0–10.5)

## 2016-12-12 LAB — URINE CULTURE: Culture: >=100000 — AB

## 2016-12-12 MED ORDER — HYDRALAZINE HCL 20 MG/ML IJ SOLN
10.0000 mg | Freq: Four times a day (QID) | INTRAMUSCULAR | Status: DC | PRN
  Administered 2016-12-12: 10 mg via INTRAVENOUS
  Filled 2016-12-12: qty 1

## 2016-12-12 MED ORDER — HYDRALAZINE HCL 20 MG/ML IJ SOLN: 10.0000 mg | Freq: Four times a day (QID) | INTRAMUSCULAR | Status: DC | PRN

## 2016-12-12 MED ORDER — ENSURE ENLIVE PO LIQD
237.0000 mL | Freq: Two times a day (BID) | ORAL | Status: DC
  Administered 2016-12-12 – 2016-12-13 (×2): 237 mL via ORAL

## 2016-12-12 MED ORDER — PHENYTOIN SODIUM EXTENDED 100 MG PO CAPS
100.0000 mg | ORAL_CAPSULE | Freq: Three times a day (TID) | ORAL | Status: DC
  Administered 2016-12-12 – 2016-12-13 (×3): 100 mg via ORAL
  Filled 2016-12-12 (×5): qty 1

## 2016-12-12 NOTE — Evaluation (Signed)
Occupational Therapy Evaluation Patient Details Name: [name] MRN: [id number] DOB: 04-Jan-1933 Today's Date: 12/12/2016    History of Present Illness This 83 y.o. female admitted with AMS after a fall at home.  Dx;  Acute metabolic enephalopathy secondary to UTI.  PMH:  COPD, DM, AAA, Lung CA, dementia.  Pt is currently a Hospice pt    Clinical Impression   Pt admitted with above. She demonstrates the below listed deficits and will benefit from continued OT to maximize safety and independence with BADLs.  Pt demonstrates impaired cognition, decreased balance, generalized weakness, decreased activity tolerance.   She was only able to tolerate EOB sitting.  BP supine was 139/76, and sitting 97/63 with pt complaining of dizziness.  02 sats 86% on 3L with EOB sitting.  She was returned to supine due to symptomatic orthostasis.   She required mod A for bed mobility.   Prior to admission, she lived with her spouse, whom RN reports uses a RW, and she received Hospice services.  At present do not feel pt would be safe to return home with spouse as she is requiring physical assist for basic mobility.  Anticipate she will require SNF level care at discharge.        Follow Up Recommendations  SNF    Equipment Recommendations  None recommended by OT    Recommendations for Other Services       Precautions / Restrictions Precautions Precautions: Fall Restrictions Weight Bearing Restrictions: No      Mobility Bed Mobility Overal bed mobility: Needs Assistance Bed Mobility: Supine to Sit;Sit to Supine     Supine to sit: Max assist Sit to supine: Max assist   General bed mobility comments: Pt requires step by step instruction and assist to move LEs to EOB, lift trunk then assist to lower trunk and lift LEs onto the bed   Transfers                      Balance Overall balance assessment: Needs assistance Sitting-balance support: Feet supported Sitting balance-Leahy  Scale: Poor Sitting balance - Comments: requires min A                                    ADL either performed or assessed with clinical judgement   ADL Overall ADL's : Needs assistance/impaired Eating/Feeding: Moderate assistance;Bed level   Grooming: Wash/dry hands;Wash/dry face;Oral care;Brushing hair;Maximal assistance;Bed level   Upper Body Bathing: Maximal assistance;Bed level   Lower Body Bathing: Total assistance;Bed level   Upper Body Dressing : Total assistance;Bed level   Lower Body Dressing: Total assistance;Bed level   Toilet Transfer: Total assistance Toilet Transfer Details (indicate cue type and reason): unable to safely attemtp  Toileting- Clothing Manipulation and Hygiene: Total assistance;Bed level         General ADL Comments: limited by confusion and orthostasis      Vision         Perception     Praxis      Pertinent Vitals/Pain Pain Assessment: Faces Faces Pain Scale: No hurt     Hand Dominance Right   Extremity/Trunk Assessment Upper Extremity Assessment Upper Extremity Assessment: Generalized weakness   Lower Extremity Assessment Lower Extremity Assessment: Defer to PT evaluation   Cervical / Trunk Assessment Cervical / Trunk Assessment: Kyphotic;Other exceptions Cervical / Trunk Exceptions: Pt keeps head/neck hyperextended while supine    Communication Communication  Communication: Expressive difficulties(difficult to understand at time )   Cognition Arousal/Alertness: Awake/alert Behavior During Therapy: Flat affect;Restless Overall Cognitive Status: Impaired/Different from baseline Area of Impairment: Orientation;Attention;Memory;Following commands;Safety/judgement;Awareness;Problem solving                 Orientation Level: Disoriented to;Time;Situation Current Attention Level: Focused   Following Commands: Follows one step commands inconsistently;Follows one step commands with increased  time Safety/Judgement: Decreased awareness of deficits;Decreased awareness of safety   Problem Solving: Slow processing;Decreased initiation;Difficulty sequencing;Requires verbal cues;Requires tactile cues General Comments: Pt with h/o dementia.  Per RN, pt's spouse reports that pt is not at baseline    General Comments  BP supine 139/76; sitting 97/63 with pt c/o dizziness.  02 sats 86% with EOB sitting with pt on 3L supplemental 02    Exercises     Shoulder Instructions      Home Living Family/patient expects to be discharged to:: Private residence Living Arrangements: Spouse/significant other                               Additional Comments: No family present, and pt unable to provide info.  Per chart, pt utilizes a w/c and lives with spouse.  Is followed by Hospice       Prior Functioning/Environment Level of Independence: Needs assistance        Comments: Pt unable to provide details and no family present.          OT Problem List: Decreased strength;Decreased activity tolerance;Impaired balance (sitting and/or standing);Decreased cognition;Decreased safety awareness;Decreased knowledge of use of DME or AE;Cardiopulmonary status limiting activity      OT Treatment/Interventions: Self-care/ADL training;Therapeutic exercise;DME and/or AE instruction;Therapeutic activities;Cognitive remediation/compensation;Patient/family education;Balance training    OT Goals(Current goals can be found in the care plan section) Acute Rehab OT Goals OT Goal Formulation: Patient unable to participate in goal setting Time For Goal Achievement: 12/26/16 Potential to Achieve Goals: Fair ADL Goals Pt Will Perform Grooming: with supervision;sitting Pt Will Perform Upper Body Bathing: with min assist;sitting Pt Will Transfer to Toilet: with min assist;stand pivot transfer;bedside commode Pt Will Perform Toileting - Clothing Manipulation and hygiene: with min assist;sit to/from  stand  OT Frequency: Min 2X/week   Barriers to D/C: Decreased caregiver support  Per RN, spouse uses RW at home        Co-evaluation              AM-PAC PT "6 Clicks" Daily Activity     Outcome Measure Help from another person eating meals?: A Little Help from another person taking care of personal grooming?: A Lot Help from another person toileting, which includes using toliet, bedpan, or urinal?: Total Help from another person bathing (including washing, rinsing, drying)?: A Lot Help from another person to put on and taking off regular upper body clothing?: A Lot Help from another person to put on and taking off regular lower body clothing?: Total 6 Click Score: 11   End of Session Equipment Utilized During Treatment: Oxygen Nurse Communication: Mobility status;Other (comment)(orthostasis and 02 sats )  Activity Tolerance: Treatment limited secondary to medical complications (Comment) Patient left: in bed;with call bell/phone within reach;with bed alarm set  OT Visit Diagnosis: Muscle weakness (generalized) (M62.81)                Time: 0175-1025 OT Time Calculation (min): 18 min Charges:  OT General Charges $OT Visit: 1 Visit OT Evaluation $OT Eval Moderate  Complexity: 1 Mod G-Codes:     [facility], OTR/L [phone]   Lucille Passy M 12/12/2016, 6:23 PM

## 2016-12-12 NOTE — Progress Notes (Signed)
Pt w/ very little urine output this shift. Having a very difficult time getting pt to drink anything due to cognitive limitations. Relayed to attending MD re: do we want to restart fluids?

## 2016-12-12 NOTE — Progress Notes (Signed)
[place] 56[facility] - [facility] GIP RN visit at 0900 am  This is a related and covered GIP admission of 12/10/16 with [facility] diagnosis of COPD, per Dr. Earlie Counts. Patient has [facility] DNR, though it did not follow patient during transport with [facility].  She is now a DNR in the hospital. Spouse activated EMS after a fall in the home. He heard patient yell out and heard her fall and found her in the floor. Spouse reports he believes she may have stood up to go to the bathroom and wheelchair was pushed out from under her. Hospice was called, but spouse did not wait for phone call to be returned. Patient was admitted to hospital for acute encephalopathy.  Visited patient in room, with nurse tech at bedside.  Patient alert and oriented to person only.  She is smiling and denies pain at this time. Patient has large bruise/laceration over left eyebrow with dried blood and large bruise on left knee. Patient is taking bites of yogurt with assistance.  Spoke to staff RN, Evelena Peat, who reports patient will most likely transfer to regular floor today d/t no acute changes. Patient continues to receive Rocephin 1 g Q 24 hours via a PIV. Patient has not required any PRN medications the past 24 hours, per chart review. Pt was loaded with fosphenytoin and now on phenytoin (DILANTIN) injection 100 mg TID as well as enoxaparin (LOVENOX) injection 40 mg Q 24 hours.  No family present at time of visit.  [facility] will continue to follow while in the hospital and anticipate any discharge needs.  Please feel free to contact with any hospice-related questions or concerns.  Thank you, Freddi Starr, RN, [facility] Liaison [place] are on [facility].

## 2016-12-12 NOTE — Progress Notes (Addendum)
Initial Nutrition Assessment  DOCUMENTATION CODES:   Not applicable  INTERVENTION:    Ensure Enlive po BID, each supplement provides 350 kcal and 20 grams of protein   Consider Cortrak feeding tube placement for nutrition support (? within pt's goals of care)  NUTRITION DIAGNOSIS:   Inadequate oral intake related to lethargy/confusion as evidenced by meal completion < 25%  GOAL:   Patient will meet greater than or equal to 90% of their needs  MONITOR:   PO intake, Supplement acceptance, Labs, Weight trends, Skin  REASON FOR ASSESSMENT:   Malnutrition Screening Tool  ASSESSMENT:    83 y.o. Female with PMH significant of RA, h/o lung cancer, HTN, hyperparathyroidism, HLD, DM, COPD (end stage, on hospice per report), AAA, dementia who presents for AMS.  Pt sleeping upon RD visit. Did not wake. Followed by [place] at home. Per Malnutrition Screening Tool Report, pt with recent unintentional weight loss.  Last weight is from November 2016. Per flowsheet records, PO intake very poor at 5%. Labs and medications reviewed.  NUTRITION - FOCUSED PHYSICAL EXAM:    Most Recent Value  Orbital Region  Unable to assess  Upper Arm Region  Unable to assess  Thoracic and Lumbar Region  Unable to assess  Buccal Region  Unable to assess  Temple Region  Unable to assess  Clavicle Bone Region  Unable to assess  Clavicle and Acromion Bone Region  Unable to assess  Scapular Bone Region  Unable to assess  Dorsal Hand  Unable to assess  Patellar Region  Unable to assess  Anterior Thigh Region  Unable to assess  Posterior Calf Region  Unable to assess  Edema (RD Assessment)  Unable to assess     Diet Order:  Diet Heart Room service appropriate? Yes; Fluid consistency: Thin  EDUCATION NEEDS:   No education needs have been identified at this time  Skin:  Skin Assessment: Skin Integrity Issues: Skin Integrity Issues:: DTI DTI: hip  Last BM:  11/3     Intake/Output Summary (Last 24 hours) at 12/12/2016 1502 Last data filed at 12/12/2016 0900 Gross per 24 hour  Intake 130 ml  Output 400 ml  Net -270 ml   Height:   Ht Readings from Last 1 Encounters:  12/10/16 5\' 3"  (1.6 m)   Weight:   Wt Readings from Last 1 Encounters:  12/11/16 157 lb 6.5 oz (71.4 kg)   Wt Readings from Last 10 Encounters:  12/11/16 157 lb 6.5 oz (71.4 kg)  12/17/14 239 lb 9.6 oz (108.7 kg)  11/13/13 219 lb 3.2 oz (99.4 kg)  11/15/11 236 lb 11.2 oz (107.4 kg)  05/09/11 236 lb 12.8 oz (107.4 kg)  10/26/10 245 lb (111.1 kg)   Ideal Body Weight:  52.2 kg  BMI:  Body mass index is 27.88 kg/m.  Estimated Nutritional Needs:   Kcal:  1400-1600  Protein:  70-85 gm  Fluid:  >/= 1.5 L  Arthur Holms, RD, LDN Pager #: [phone] After-Hours Pager #: [phone]

## 2016-12-12 NOTE — Progress Notes (Signed)
[facility] note Patient is currently receiving hospice services at home. She lives with her husband, Glendell Docker who is the primary caregiver. She has support from a daughter who lives in [place] area. Patient was asleep upon entry and awakened to greet LCSW. She denied pain, yet was unable to fully wake to talk. Patient is conversant and quite animated at home, yet has periods of confusion, hallucinations. Her husband was not in the room and LCSW called, left him message of visit. Husband has knee issues and ambulation is difficult.  LCSW to continue to follow during hospital stay and upon discharge. Katherina Right, [place]

## 2016-12-12 NOTE — Progress Notes (Signed)
PROGRESS NOTE [facility]   [name]   WFU:[id number] DOB: 1932/07/05  DOA: 12/10/2016 PCP: Jilda Panda, MD   Brief Narrative:  [name] is a 83 year old female with medical history of COPD, diabetes mellitus, AAA, lung cancer status post resection, dementia who was brought to the emergency department after a fall and found to have altered mental status.  Upon ED evaluation she was found to be confused, neurology was consulted and felt the story was suspicious for seizure-like activity, patient was loaded with fosphenytoin, and continue phenytoin 100 mg 3 times daily.  Patient was admitted with working diagnosis of UTI rule out CVA.  Subjective: Patient seen and examined, doing well, she is alert and oriented to person only, which family report that this is he baseline mentally. No acute events overnight. BP elevated today, her metoprolol was resumed last night.   Assessment & Plan: Acute metabolic encephalopathy secondary to UTI - Mentally back to baseline per family  Suspected seizure -loaded with fosphenytoin and continued phenytoin 100 mg 3 times daily.  Discussed with neurology, recommended to continue Dilantin 100 mg TID and follow up as outpatient Neurology recommendations appreciated, requesting MRI w+wo contrast given hx of CA - limited MRI with no acute findings  Treat UTI   UTI - urine culture Klebsiella pneumoniae sensitive to rocephin  Continue rocephin today transition to PO tomorrow - Cefdinir   COPD/chronic hypercabia - stable no signs of exacerbation  Keep O2 supplement to keep O2 sat > 88%  Duoneb PRN   HTN  BP remains elevated - metoprolol was added last night  Will add hydralazine PRN for SBP > 160 Monitor BP   AAA On CT angiogram of the abdomen seems that the aneurysm has slightly increased.  Patient has symptomatic.  Will discuss findings with vascular surgery.   Fall  Likely due to encephalopathy and deconditioning  Treating  underlying causes  PT/OT eval   Acute renal failure - due to infectious process  Meet criteria, although no Cr baseline, renal function improved improve to normal in 48 hr after hydration   Cr stable - continue to monitor   DVT prophylaxis: Lovenox Code Status: DNR Family Communication: None at bedside Disposition Plan: TBD   Consultants:   Neurology  Procedures:   None  Antimicrobials: Anti-infectives (From admission, onward)   Start     Dose/Rate Route Frequency Ordered Stop   12/11/16 1800  cefTRIAXone (ROCEPHIN) 1 g in dextrose 5 % 50 mL IVPB     1 g 100 mL/hr over 30 Minutes Intravenous Every 24 hours 12/11/16 0138     12/10/16 1800  cefTRIAXone (ROCEPHIN) 1 g in dextrose 5 % 50 mL IVPB     1 g 100 mL/hr over 30 Minutes Intravenous  Once 12/10/16 1749 12/10/16 1840       Objective: Vitals:   12/11/16 2340 12/12/16 0140 12/12/16 0453 12/12/16 0802  BP: (!) 143/81   (!) 176/164  Pulse: 100   (!) 107  Resp: 20   (!) 21  Temp: (!) 97.5 F (36.4 C) 97.7 F (36.5 C) 98.1 F (36.7 C) 97.7 F (36.5 C)  TempSrc: Axillary Axillary Oral Oral  SpO2: 92%   95%  Weight:      Height:        Intake/Output Summary (Last 24 hours) at 12/12/2016 0911 Last data filed at 12/11/2016 2345 Gross per 24 hour  Intake 110 ml  Output 400 ml  Net -290 ml   Filed  Weights   12/10/16 2115 12/11/16 0444  Weight: 72 kg (158 lb 11.7 oz) 71.4 kg (157 lb 6.5 oz)    Examination:  General: NAD Alert and oriented to person only Cardiovascular: RRR, systolic murmurs  Respiratory: Air entry improved, still somewhat decreased throughout. O2 @ 3L  baseline  Abdominal: Soft, NT, ND, bowel sounds + Extremities: no edema Skin: Large bruise over left eyebrow. Multiple ecchymoses   Data Reviewed: I have personally reviewed following labs and imaging studies  CBC: Recent Labs  Lab 12/10/16 1415 12/10/16 1429 12/11/16 0153 12/12/16 0537  WBC 5.8  --  6.8 8.5  NEUTROABS 3.5  --   --   6.1  HGB 11.4* 12.2 11.5* 11.9*  HCT 37.0 36.0 37.0 38.8  MCV 90.0  --  90.0 89.6  PLT 132*  --  132* 921*   Basic Metabolic Panel: Recent Labs  Lab 12/10/16 1415 12/10/16 1429 12/11/16 0153 12/12/16 0537  NA 138 139 138 139  K 3.1* 3.1* 4.2 4.0  CL 96* 96* 98* 97*  CO2 33*  --  29 30  GLUCOSE 161* 162* 118* 114*  BUN 14 18 12 9   CREATININE 1.22* 1.20* 1.01* 0.80  CALCIUM 10.1  --  9.5 9.9   GFR: Estimated Creatinine Clearance: 51.4 mL/min (by C-G formula based on SCr of 0.8 mg/dL). Liver Function Tests: Recent Labs  Lab 12/10/16 1415  AST 33  ALT 25  ALKPHOS 54  BILITOT 1.1  PROT 6.5  ALBUMIN 3.8   No results for input(s): LIPASE, AMYLASE in the last 168 hours. Recent Labs  Lab 12/10/16 1551  AMMONIA 47*   Coagulation Profile: Recent Labs  Lab 12/10/16 1415  INR 1.05   Cardiac Enzymes: No results for input(s): CKTOTAL, CKMB, CKMBINDEX, TROPONINI in the last 168 hours. BNP (last 3 results) No results for input(s): PROBNP in the last 8760 hours. HbA1C: No results for input(s): HGBA1C in the last 72 hours. CBG: No results for input(s): GLUCAP in the last 168 hours. Lipid Profile: No results for input(s): CHOL, HDL, LDLCALC, TRIG, CHOLHDL, LDLDIRECT in the last 72 hours. Thyroid Function Tests: Recent Labs    12/10/16 2221  TSH 0.204*   Anemia Panel: No results for input(s): VITAMINB12, FOLATE, FERRITIN, TIBC, IRON, RETICCTPCT in the last 72 hours. Sepsis Labs: Recent Labs  Lab 12/10/16 1429 12/10/16 1840 12/10/16 2221 12/11/16 0153  LATICACIDVEN 2.30* 2.47* 1.8 1.6    Recent Results (from the past 240 hour(s))  Urine Culture     Status: Abnormal   Collection Time: 12/10/16  4:23 PM  Result Value Ref Range Status   Specimen Description URINE, CATHETERIZED  Final   Special Requests NONE  Final   Culture >=100,000 COLONIES/mL KLEBSIELLA PNEUMONIAE (A)  Final   Report Status 12/12/2016 FINAL  Final   Organism ID, Bacteria KLEBSIELLA  PNEUMONIAE (A)  Final      Susceptibility   Klebsiella pneumoniae - MIC*    AMPICILLIN >=32 RESISTANT Resistant     CEFAZOLIN <=4 SENSITIVE Sensitive     CEFTRIAXONE <=1 SENSITIVE Sensitive     CIPROFLOXACIN <=0.25 SENSITIVE Sensitive     GENTAMICIN <=1 SENSITIVE Sensitive     IMIPENEM <=0.25 SENSITIVE Sensitive     NITROFURANTOIN 64 INTERMEDIATE Intermediate     TRIMETH/SULFA <=20 SENSITIVE Sensitive     AMPICILLIN/SULBACTAM 4 SENSITIVE Sensitive     PIP/TAZO <=4 SENSITIVE Sensitive     Extended ESBL NEGATIVE Sensitive     * >=100,000 COLONIES/mL KLEBSIELLA PNEUMONIAE  MRSA PCR Screening     Status: None   Collection Time: 12/10/16  9:11 PM  Result Value Ref Range Status   MRSA by PCR NEGATIVE NEGATIVE Final    Comment:        The GeneXpert MRSA Assay (FDA approved for NASAL specimens only), is one component of a comprehensive MRSA colonization surveillance program. It is not intended to diagnose MRSA infection nor to guide or monitor treatment for MRSA infections.       Radiology Studies: Dg Chest 1 View  Result Date: 12/10/2016 CLINICAL DATA:  Patient status post fall. Altered mental status. Right hip pain. EXAM: CHEST 1 VIEW COMPARISON:  Chest CT 11/10/2014. FINDINGS: Patient is rotated to the left. Cardiomegaly. Aortic atherosclerosis. Multiple monitoring leads overlie the patient. No large area of pulmonary consolidation. No pleural effusion or pneumothorax. IMPRESSION: Limited rotated portable exam. Cardiomegaly. No large area pulmonary consolidation. Electronically Signed   By: Lovey Newcomer M.D.   On: 12/10/2016 15:05   Ct Head Wo Contrast  Result Date: 12/10/2016 CLINICAL DATA:  Initial evaluation for acute trauma, fall. EXAM: CT HEAD WITHOUT CONTRAST CT CERVICAL SPINE WITHOUT CONTRAST TECHNIQUE: Multidetector CT imaging of the head and cervical spine was performed following the standard protocol without intravenous contrast. Multiplanar CT image reconstructions of the  cervical spine were also generated. COMPARISON:  Prior CT from 12/14/2007. FINDINGS: CT HEAD FINDINGS Brain: Moderate cerebral atrophy with chronic small vessel ischemic disease. No acute intracranial hemorrhage. No acute large vessel territory infarct. No mass lesion, midline shift or mass effect. No hydrocephalus. No extra-axial fluid collection. Vascular: No hyperdense vessel. Scattered vascular calcifications noted within the carotid siphons. Skull: CED soft tissue contusions present at the left forehead and left periorbital region. Calvarium intact. Sinuses/Orbits: Globes and orbital soft tissues demonstrate no acute abnormality. Postsurgical changes from prior scleral banding present on the left. Patient status post cataract extraction bilaterally. Axial myopia noted. Minimal mucosal thickening noted within the ethmoidal air cells. Paranasal sinuses are otherwise clear. No mastoid effusion. Other: None. CT CERVICAL SPINE FINDINGS Alignment:  Study degraded by motion artifact. Vertebral bodies normally aligned with preservation of the normal cervical lordosis. No listhesis. Skull base and vertebrae: Skullbase intact. Mild rotation of C1 on C2 likely positional. Dens intact. Vertebral body heights maintained. No acute fracture. Soft tissues and spinal canal: Soft tissues of the neck demonstrate no acute abnormality. No prevertebral edema. Vascular calcifications present about the carotid bifurcations. Scattered calcifications with subcentimeter hypodense nodules noted within the thyroid, of doubtful significance. Disc levels: Mild to moderate degenerate spondylolysis at C5-6 and C6-7. Upper chest: Visualized upper chest demonstrates no acute abnormality. Visualized lung apices are clear. Other: None. IMPRESSION: CT BRAIN: 1. No acute intracranial process. 2. Soft tissue contusions at the left frontal scalp and left periorbital region. Calvarium intact. 3. Moderate cerebral atrophy with chronic small vessel  ischemic disease. CT CERVICAL SPINE: 1. No acute traumatic injury within cervical spine. 2. Mild-to-moderate degenerative spondylolysis at C5-6 and C6-7. Electronically Signed   By: Jeannine Boga M.D.   On: 12/10/2016 16:11   Ct Cervical Spine Wo Contrast  Result Date: 12/10/2016 CLINICAL DATA:  Initial evaluation for acute trauma, fall. EXAM: CT HEAD WITHOUT CONTRAST CT CERVICAL SPINE WITHOUT CONTRAST TECHNIQUE: Multidetector CT imaging of the head and cervical spine was performed following the standard protocol without intravenous contrast. Multiplanar CT image reconstructions of the cervical spine were also generated. COMPARISON:  Prior CT from 12/14/2007. FINDINGS: CT HEAD FINDINGS Brain: Moderate cerebral  atrophy with chronic small vessel ischemic disease. No acute intracranial hemorrhage. No acute large vessel territory infarct. No mass lesion, midline shift or mass effect. No hydrocephalus. No extra-axial fluid collection. Vascular: No hyperdense vessel. Scattered vascular calcifications noted within the carotid siphons. Skull: CED soft tissue contusions present at the left forehead and left periorbital region. Calvarium intact. Sinuses/Orbits: Globes and orbital soft tissues demonstrate no acute abnormality. Postsurgical changes from prior scleral banding present on the left. Patient status post cataract extraction bilaterally. Axial myopia noted. Minimal mucosal thickening noted within the ethmoidal air cells. Paranasal sinuses are otherwise clear. No mastoid effusion. Other: None. CT CERVICAL SPINE FINDINGS Alignment:  Study degraded by motion artifact. Vertebral bodies normally aligned with preservation of the normal cervical lordosis. No listhesis. Skull base and vertebrae: Skullbase intact. Mild rotation of C1 on C2 likely positional. Dens intact. Vertebral body heights maintained. No acute fracture. Soft tissues and spinal canal: Soft tissues of the neck demonstrate no acute abnormality. No  prevertebral edema. Vascular calcifications present about the carotid bifurcations. Scattered calcifications with subcentimeter hypodense nodules noted within the thyroid, of doubtful significance. Disc levels: Mild to moderate degenerate spondylolysis at C5-6 and C6-7. Upper chest: Visualized upper chest demonstrates no acute abnormality. Visualized lung apices are clear. Other: None. IMPRESSION: CT BRAIN: 1. No acute intracranial process. 2. Soft tissue contusions at the left frontal scalp and left periorbital region. Calvarium intact. 3. Moderate cerebral atrophy with chronic small vessel ischemic disease. CT CERVICAL SPINE: 1. No acute traumatic injury within cervical spine. 2. Mild-to-moderate degenerative spondylolysis at C5-6 and C6-7. Electronically Signed   By: Jeannine Boga M.D.   On: 12/10/2016 16:11   Dg Knee Complete 4 Views Left  Result Date: 12/10/2016 CLINICAL DATA:  Patient status post fall. Knee pain. Initial encounter. EXAM: LEFT KNEE - COMPLETE 4+ VIEW COMPARISON:  None. FINDINGS: Normal anatomic alignment. Tricompartmental osteophytosis. Chondrocalcinosis. No evidence for acute fracture dislocation. Regional soft tissues are unremarkable. No joint effusion. IMPRESSION: No acute osseous abnormality. Degenerative changes. Electronically Signed   By: Lovey Newcomer M.D.   On: 12/10/2016 15:07   Mr Brain Limited Wo Contrast  Result Date: 12/11/2016 CLINICAL DATA:  Altered level of consciousness. Fall with LEFT orbital bruising, weakness. EXAM: MRI HEAD WITHOUT CONTRAST TECHNIQUE: Axial and coronal diffusion weighted imaging obtained on a 1.5 tesla scanner. Patient terminated the examination by climbing out of the scanner. COMPARISON:  CT HEAD December 10, 2016 FINDINGS: BRAIN: No reduced diffusion to suggest acute ischemia, hypercellular tumor, typical infection. No midline shift or mass effect. No hydrocephalus. VASCULAR: Nondiagnostic. SKULL AND UPPER CERVICAL SPINE: Nondiagnostic.  SINUSES/ORBITS: Nondiagnostic. OTHER: None. IMPRESSION: Limited 2 sequence MRI of the head without acute intracranial process. Electronically Signed   By: Elon Alas M.D.   On: 12/11/2016 04:40   Ct Angio Chest/abd/pel For Dissection W And/or Wo Contrast  Result Date: 12/10/2016 CLINICAL DATA:  Patient with history of abdominal aortic aneurysm. Recent fall. History of lung cancer. Vomiting. EXAM: CT ANGIOGRAPHY CHEST, ABDOMEN AND PELVIS TECHNIQUE: Multidetector CT imaging through the chest, abdomen and pelvis was performed using the standard protocol during bolus administration of intravenous contrast. Multiplanar reconstructed images and MIPs were obtained and reviewed to evaluate the vascular anatomy. CONTRAST:  80 cc Isovue 370 COMPARISON:  CT chest 11/10/2014. FINDINGS: CTA CHEST FINDINGS Cardiovascular: Normal heart size. Coronary arterial vascular calcifications. Thoracic aortic vascular calcifications. No peripheral high attenuation within the thoracic aorta to suggest acute intramural hematoma. Mediastinum/Nodes: No enlarged axillary, mediastinal or  hilar lymphadenopathy. Small hiatal hernia. Lungs/Pleura: Central airways are patent. Slight interval increase in right paramediastinal soft tissue measuring up to 2 cm (image 44; series 7), previously 1.3 cm. Subpleural airspace opacities within the medial right lower lobe (image 45; series 2). New 11 mm ground-glass nodule within the right lower lobe (image 69; series 7). New 4 mm right lower lobe nodule (image 57; series 7). No pleural effusion or pneumothorax. Musculoskeletal: Thoracic spine degenerative changes. No aggressive or acute appearing osseous lesions. Review of the MIP images confirms the above findings. CTA ABDOMEN AND PELVIS FINDINGS VASCULAR Aorta: Bilobed abdominal aortic aneurysm originating at the level of the celiac axis and the left renal artery and extending to the distal abdominal aorta. Aneurysm sac at the level of the origin  of the left renal artery measures 5.0 x 5.0 cm (image 98; series 6). This is increased from prior exam where it measured 4.3 x 4.8 cm. The inferior aspect of the bilobed aneurysm measures 4.5 x 4.6 cm, previously 2.8 x 3.0 cm. There is nonspecific fat stranding about the abdominal aortic aneurysm. Celiac: Extensive atherosclerotic narrowing at the origin and along the course of the celiac axis. SMA: Patent however narrowed at the origin with calcified atherosclerotic plaque. Renals: The right renal artery is patent however there is focal saccular aneurysm at the origin of the right renal artery which measures 1.4 cm (image 76; series 9). The left renal artery is patent. There is focal saccular outpouching at the level of the origin measuring 2.2 cm (image 82; series 9). IMA: Patent Veins: Unremarkable Review of the MIP images confirms the above findings. NON-VASCULAR Hepatobiliary: The liver is normal in size and contour. No focal hepatic lesion is identified. No intrahepatic or extrahepatic biliary ductal dilatation. Pancreas: Unchanged 2.2 cm low-attenuation lesion within the pancreatic body (image 92; series 6). Spleen: Unremarkable Adrenals/Urinary Tract: Re- demonstrated 4.2 x 2.9 cm left adrenal nodule. Right adrenal gland is normal. Kidneys enhance symmetrically with contrast. No hydronephrosis. There is a 2.3 cm partially exophytic cyst off the inferior pole the left kidney. Stomach/Bowel: Descending and sigmoid colonic diverticulosis. Mild fat stranding about the sigmoid colon. No evidence for bowel obstruction. No free fluid or free intraperitoneal air. Normal morphology of the stomach. Lymphatic: Multiple prominent aortocaval lymph nodes are demonstrated measuring up to 1.5 cm (image 116; series 6), previously 1.5 cm. No new or progressive retroperitoneal adenopathy. Reproductive: Uterus and adnexal structures are unremarkable. Other: None. Musculoskeletal: Lumbar spine degenerative changes. No aggressive  or acute appearing osseous lesions. Review of the MIP images confirms the above findings. IMPRESSION: 1. There is a bilobed abdominal aortic aneurysm which originates at the level of the celiac axis and extends caudally to near the bifurcation. There is fat stranding about the aneurysm sac which is nonspecific in etiology. Given the fat stranding about the aortic aneurysm sac, impending rupture cannot be excluded. 2. Slight interval progression of airspace opacity within the medial right lung and right perihilar location. Recommend attention on follow-up to exclude the possibility of disease progression 3. Mild fat stranding about the sigmoid colon raising the possibility of diverticulitis. 4. There a few small pulmonary nodules within the right lung as detailed above. Recommend attention on follow-up chest CT. 5. Re- demonstrated pancreatic and adrenal lesions, likely benign in etiology, recommend attention on follow-up. 6. Unchanged T6 wedge compression deformity. 7. Critical Value/emergent results were called by telephone at the time of interpretation on 12/10/2016 at 6:03 pm to Dr. Addison Lank ,  who verbally acknowledged these results. Electronically Signed   By: Lovey Newcomer M.D.   On: 12/10/2016 18:15   Dg Hip Unilat With Pelvis 2-3 Views Right  Result Date: 12/10/2016 CLINICAL DATA:  Right hip pain following a fall today. EXAM: DG HIP (WITH OR WITHOUT PELVIS) 2-3V RIGHT COMPARISON:  Abdomen and pelvis CT dated 04/17/2008. FINDINGS: Diffuse osteopenia. No fracture or dislocation seen. Lower lumbar spine degenerative changes. Atheromatous arterial calcifications with a distal abdominal aortic aneurysm measuring 5.2 cm in maximum transverse diameter without correction for magnification. This measured 4.5 cm in maximum diameter 04/17/2008. IMPRESSION: 1. Infrarenal abdominal aortic aneurysm measuring 5.2 cm in maximum transverse diameter without correction for magnification. This measured 4.5 cm on  04/17/2008. Further evaluation with an abdomen and pelvis CT with contrast is recommended. 2. No hip fracture or dislocation seen. Electronically Signed   By: Claudie Revering M.D.   On: 12/10/2016 15:11    Scheduled Meds: . aspirin EC  81 mg Oral Daily  . enoxaparin (LOVENOX) injection  40 mg Subcutaneous Q24H  . mouth rinse  15 mL Mouth Rinse BID  . metoprolol tartrate  50 mg Oral Daily  . phenytoin (DILANTIN) IV  100 mg Intravenous TID  . sodium chloride flush  3 mL Intravenous Q12H   Continuous Infusions: . cefTRIAXone (ROCEPHIN)  IV Stopped (12/11/16 2002)     LOS: 2 days    Time spent: Total of 25 minutes spent with pt, greater than 50% of which was spent in discussion of  treatment, counseling and coordination of care   Chipper Oman, MD Pager: Text Page via [email or web address]   If 7PM-7AM, please contact night-coverage [email or web address] 12/12/2016, 9:11 AM

## 2016-12-13 MED ORDER — PHENYTOIN SODIUM EXTENDED 100 MG PO CAPS: 100.0000 mg | ORAL_CAPSULE | Freq: Three times a day (TID) | ORAL | 0 refills | Status: AC

## 2016-12-13 MED ORDER — CEFPODOXIME PROXETIL 200 MG PO TABS
200.0000 mg | ORAL_TABLET | Freq: Two times a day (BID) | ORAL | Status: DC
  Administered 2016-12-13: 200 mg via ORAL
  Filled 2016-12-13 (×2): qty 1

## 2016-12-13 MED ORDER — CEFPODOXIME PROXETIL 200 MG PO TABS: 200.0000 mg | ORAL_TABLET | Freq: Two times a day (BID) | ORAL | 0 refills | Status: AC

## 2016-12-13 NOTE — Plan of Care (Signed)
  Acute Rehab PT Goals(only PT should resolve) Pt Will Go Supine/Side To Sit 12/13/2016 1150 by Irwin Brakeman F, PT Flowsheets Taken 12/13/2016 1150  Pt will go Supine/Side to Sit with minimal assist Patient Will Perform Sitting Balance 12/13/2016 1150 by Irwin Brakeman F, PT Flowsheets Taken 12/13/2016 1150  Patient will perform sitting balance 3- 5 min;with supervision;with bilateral UE support Patient Will Transfer Sit To/From Stand 12/13/2016 1150 by Denice Paradise, PT Flowsheets Taken 12/13/2016 1150  Patient will transfer sit to/from stand with minimal assist Pt Will Transfer Bed To Chair/Chair To Bed 12/13/2016 1150 by Irwin Brakeman F, PT Flowsheets Taken 12/13/2016 1150  Pt will Transfer Bed to Chair/Chair to Bed with min assist  [facility] Acute Rehabilitation [phone] (pager)

## 2016-12-13 NOTE — Care Management Note (Signed)
Case Management Note  Patient Details  Name: [name] MRN: [id number] Date of Birth: Oct 08, 1932  Subjective/Objective:   From home with spouse, active with [facility] at home.  Amy with [facility] notified that patient will be dc home today via EMS at 3pm , daughter Levander Campion will also be there with patient's spouse.  She presents with uti,copd/chronic  Hypercabia, acute met encephalopathy secondary to uti, htn, AAA, fall, ARF.                   Action/Plan: DC home with hospice ([facility]) via EMS.   Expected Discharge Date:                  Expected Discharge Plan:  Home w Hospice Care  In-House Referral:     Discharge planning Services  CM Consult  Post Acute Care Choice:  Resumption of Svcs/PTA Provider Choice offered to:     DME Arranged:    DME Agency:     HH Arranged:  RN [place] Agency:  [facility]   Status of Service:  Completed, signed off  If discussed at [place] of Stay Meetings, dates discussed:    Additional Comments:  Zenon Mayo, RN 12/13/2016, 12:54 PM

## 2016-12-13 NOTE — Evaluation (Signed)
Physical Therapy Evaluation Patient Details Name: [name] MRN: [id number] DOB: [birth year] Today's Date: 12/13/2016   History of Present Illness  This 83 y.o. female admitted with AMS after a fall at home.  Dx;  Acute metabolic enephalopathy secondary to UTI.  PMH:  COPD, DM, AAA, Lung CA, dementia.  Pt is currently a Hospice pt   Clinical Impression  Pt admitted with above diagnosis. Pt currently with functional limitations due to the deficits listed below (see PT Problem List). Pt was only able to sit EOB and needed 2 persons to stand. Husband present and worried that he cannot help pt at home.  He states he has to call Hospice and see if they can help more.  Suggested SNF for rehab to pt and husband as 1 option and the second option is home with ambulance transport, hoyer lift and hire a caregiver to help husband. Will follow acutely.   Pt will benefit from skilled PT to increase their independence and safety with mobility to allow discharge to the venue listed below.      Follow Up Recommendations SNF;Supervision/Assistance - 24 hour    Equipment Recommendations  Other (comment);3in1 (PT)(hoyer lift if pt goes home)    Recommendations for Other Services       Precautions / Restrictions Precautions Precautions: Fall Restrictions Weight Bearing Restrictions: No      Mobility  Bed Mobility Overal bed mobility: Needs Assistance Bed Mobility: Supine to Sit;Sit to Supine     Supine to sit: Max assist Sit to supine: Max assist   General bed mobility comments: Pt requires step by step instruction and assist to move LEs to EOB, lift trunk then assist to lower trunk and lift LEs onto the bed   Transfers Overall transfer level: Needs assistance Equipment used: 2 person hand held assist Transfers: Sit to/from Stand Sit to Stand: Max assist;+2 physical assistance         General transfer comment: Pt needed assist to stand of 2 people and could not stand fully upright.   Pt began pooping as she stood. Husband in [place] nd he is concerned of how he will care for her at home. Tried to get pt to take a few steps to [facility].  Pt had a lot of difficulty doing so needing mod assist and cues and only took 2 steps. NT had come in to help PT and helped PT get pt back in bed.   Ambulation/Gait                Stairs            Wheelchair Mobility    Modified Rankin (Stroke Patients Only)       Balance Overall balance assessment: Needs assistance Sitting-balance support: Feet supported;Bilateral upper extremity supported Sitting balance-Leahy Scale: Poor Sitting balance - Comments: requires min A  Postural control: Posterior lean Standing balance support: Bilateral upper extremity supported;During functional activity Standing balance-Leahy Scale: Poor Standing balance comment: relies on UE support and external support by staff                             Pertinent Vitals/Pain Pain Assessment: Faces Faces Pain Scale: Hurts little more Pain Location: generalized Pain Descriptors / Indicators: Grimacing;Sore Pain Intervention(s): Limited activity within patient's tolerance;Monitored during session;Repositioned    Home Living Family/patient expects to be discharged to:: Private residence Living Arrangements: Spouse/significant other Available Help at Discharge: Family;Available 24 hours/day(husband but he  is concerned he can't do it after seeing rx) Type of Home: House Home Access: Stairs to enter   [employer] of Steps: 3 Home Layout: One level Home Equipment: Wheelchair - [employer] - 2 wheels;Shower seat;Hospital bed(3LO2 at home) Additional Comments: Has hospice care    Prior Function Level of Independence: Needs assistance   Gait / Transfers Assistance Needed: used wheelchair or walked with RW with assist in home  ADL's / Homemaking Assistance Needed: assist by husband        Hand Dominance   Dominant Hand:  Right    Extremity/Trunk Assessment   Upper Extremity Assessment Upper Extremity Assessment: Defer to OT evaluation    Lower Extremity Assessment Lower Extremity Assessment: RLE deficits/detail;LLE deficits/detail RLE Deficits / Details: grossly 3-/5 LLE Deficits / Details: grossly 3-/5    Cervical / Trunk Assessment Cervical / Trunk Assessment: Kyphotic;Other exceptions Cervical / Trunk Exceptions: Pt keeps head/neck hyperextended while supine  Keeps head and neck down when sitting  Communication   Communication: Expressive difficulties(difficult to understand at time )  Cognition Arousal/Alertness: Awake/alert Behavior During Therapy: Flat affect;Restless Overall Cognitive Status: Impaired/Different from baseline Area of Impairment: Orientation;Attention;Memory;Following commands;Safety/judgement;Awareness;Problem solving                 Orientation Level: Disoriented to;Time;Situation Current Attention Level: Focused   Following Commands: Follows one step commands inconsistently;Follows one step commands with increased time Safety/Judgement: Decreased awareness of deficits;Decreased awareness of safety   Problem Solving: Slow processing;Decreased initiation;Difficulty sequencing;Requires verbal cues;Requires tactile cues General Comments: Pt with h/o dementia.  Per RN, pt's spouse reports that pt is not at baseline       General Comments General comments (skin integrity, edema, etc.): VSS    Exercises General Exercises - Lower Extremity Long Arc Quad: AROM;Both;10 reps;Seated   Assessment/Plan    PT Assessment Patient needs continued PT services  PT Problem List Decreased activity tolerance;Decreased balance;Decreased mobility;Decreased knowledge of use of DME;Decreased safety awareness;Decreased cognition;Decreased knowledge of precautions       PT Treatment Interventions DME instruction;Gait training;Functional mobility training;Therapeutic  activities;Therapeutic exercise;Balance training;Patient/family education    PT Goals (Current goals can be found in the Care Plan section)  Acute Rehab PT Goals Patient Stated Goal: to go home PT Goal Formulation: With patient Time For Goal Achievement: 12/27/16 Potential to Achieve Goals: Good    Frequency Min 3X/week   Barriers to discharge Decreased caregiver support(husband worried if he can help pt)      Co-evaluation               AM-PAC PT "6 Clicks" Daily Activity  Outcome Measure Difficulty turning over in bed (including adjusting bedclothes, sheets and blankets)?: Unable Difficulty moving from lying on back to sitting on the side of the bed? : Unable Difficulty sitting down on and standing up from a chair with arms (e.g., wheelchair, bedside commode, etc,.)?: A Lot Help needed moving to and from a bed to chair (including a wheelchair)?: Total Help needed walking in hospital room?: Total Help needed climbing 3-5 steps with a railing? : Total 6 Click Score: 7    End of Session Equipment Utilized During Treatment: Gait belt;Oxygen Activity Tolerance: Patient limited by fatigue Patient left: with call bell/phone within reach;in bed;with bed alarm set;with family/visitor present Nurse Communication: Mobility status;Need for lift equipment PT Visit Diagnosis: Unsteadiness on feet (R26.81);Muscle weakness (generalized) (M62.81)    Time: 3361-2244 PT Time Calculation (min) (ACUTE ONLY): 32 min   Charges:   PT Evaluation $  PT Eval Moderate Complexity: 1 Mod PT Treatments $Therapeutic Activity: 8-22 mins   PT G Codes:        Livian Vanderbeck,PT Acute Rehabilitation [phone] (pager)   Denice Paradise 12/13/2016, 11:47 AM

## 2016-12-13 NOTE — Discharge Summary (Signed)
Physician Discharge Summary  [name]  RCV:[id number]  DOB: Sep 03, 1932  DOA: 12/10/2016 PCP: Jilda Panda, MD  Admit date: 12/10/2016 Discharge date: 12/13/2016  Admitted From: Home  Disposition:  Home   Recommendations for Outpatient Follow-up:  1. Follow up with PCP in 1-2 weeks 2. Please obtain BMP/CBC in one week to monitor renal function and Hgb 3. Follow up with neurology in 2-4 weeks  Discharge Condition: Good  CODE STATUS: DNR  Diet recommendation: Heart Healthy   Brief/Interim Summary: For full details see H&P/Progress note but in brief, [name] is a 83 year old female with medical history of COPD, diabetes mellitus, AAA, lung cancer status post resection, dementia who was brought to the emergency department after a fall and found to have altered mental status.  Upon ED evaluation she was found to be confused, neurology was consulted and felt the story was suspicious for seizure-like activity, patient was loaded with fosphenytoin, and continue phenytoin 100 mg 3 times daily.  Patient was admitted with working diagnosis of UTI rule out CVA. Neurology was consulted felt to be seizures, placed on Dilantin. Limited MRI with no acute findings. Patient was treated with IV abx for UTI, subsequently patient return to mental baseline. Patient was evaluated by PT whom recommended SNF for STR, family declined. Patient will be discharge home to resume hospice services.   Subjective: Patient seen and examined, doing well. Very alert and interactive. Patient remains afebrile, and tolerating diety well.   Discharge Diagnoses/Hospital Course:  Acute metabolic encephalopathy secondary to UTI - Mentally back to baseline per family  Suspected seizure -loaded with fosphenytoin and continued phenytoin 100 mg 3 times daily.  Discussed with neurology, recommended to continue Dilantin 100 mg TID and follow up as outpatient Neurology recommendations appreciated, requesting MRI w+wo  contrast given hx of CA - limited MRI with no acute findings No further work up at this time  Treat UTI  UTI - urine culture Klebsiella pneumoniae sensitive to rocephin  Patient was treated with rocephin for 3 days, changed to Vantin PO to complete total of 7 days.   COPD/chronic hypercabia - stable no signs of exacerbation  Patient on chronic O2 baseline 2-4L at home   HTN  BP stable  Continue home medications  Follow up as outpatient   AAA On CT angiogram of the abdomen seems that the aneurysm has slightly increased.  Patient has symptomatic. Case discussed with vascular surgery Dr Donnetta Hutching, does not recommend further monitoring at this point, given multiple comorbidities patient not candidate for surgical procedure. Follow up with PCP   Fall  Due to encephalopathy and deconditioning  Treating underlying causes  PT/OT eval recommending SNF, family declining   Acute renal failure - due to infectious process  Meet criteria, although no Cr baseline, renal function improved improve to normal in 48 hr after hydration   Cr stable upon discharge   All other chronic medical condition were stable during the hospitalization.  Patient was seen by physical therapy, recommending SNF  On the day of the discharge the patient's vitals were stable, and no other acute medical condition were reported by patient. Patient was felt safe to be discharge to home.   Discharge Instructions  You were cared for by a hospitalist during your hospital stay. If you have any questions about your discharge medications or the care you received while you were in the hospital after you are discharged, you can call the unit and asked to speak with the hospitalist on  call if the hospitalist that took care of you is not available. Once you are discharged, your primary care physician will handle any further medical issues. Please note that NO REFILLS for any discharge medications will be authorized once you are  discharged, as it is imperative that you return to your primary care physician (or establish a relationship with a primary care physician if you do not have one) for your aftercare needs so that they can reassess your need for medications and monitor your lab values.  Discharge Instructions    Call MD for:  difficulty breathing, headache or visual disturbances   Complete by:  As directed    Call MD for:  extreme fatigue   Complete by:  As directed    Call MD for:  hives   Complete by:  As directed    Call MD for:  persistant dizziness or light-headedness   Complete by:  As directed    Call MD for:  persistant nausea and vomiting   Complete by:  As directed    Call MD for:  redness, tenderness, or signs of infection (pain, swelling, redness, odor or green/yellow discharge around incision site)   Complete by:  As directed    Call MD for:  severe uncontrolled pain   Complete by:  As directed    Call MD for:  temperature >100.4   Complete by:  As directed    Diet - low sodium heart healthy   Complete by:  As directed    Increase activity slowly   Complete by:  As directed      Allergies as of 12/13/2016   No Known Allergies     Medication List    STOP taking these medications   VICKS DAYQUIL COLD & FLU 10-5-325 MG/15ML Liqd Generic drug:  DM-Phenylephrine-Acetaminophen     TAKE these medications   acetaminophen 500 MG tablet Commonly known as:  TYLENOL Take 500 mg by mouth 2 (two) times daily.   aspirin EC 81 MG tablet Take 81 mg by mouth daily.   cefpodoxime 200 MG tablet Commonly known as:  VANTIN Take 1 tablet (200 mg total) every 12 (twelve) hours for 2 days by mouth.   fluocinonide cream 0.05 % Commonly known as:  LIDEX Apply 1 application topically 2 (two) times daily as needed (itching/psoriasis).   HYDROcodone-acetaminophen 5-325 MG tablet Commonly known as:  NORCO/VICODIN Take 1 tablet by mouth every 6 (six) hours as needed for moderate pain.   hydrOXYzine  10 MG tablet Commonly known as:  ATARAX/VISTARIL Take 10 mg by mouth every 4 (four) hours as needed for itching (psoriasis).   loperamide 2 MG tablet Commonly known as:  IMODIUM A-D Take 2-4 mg by mouth See admin instructions. Take 2 tablets (4 mg) by mouth after 1st loose stool, then take 1 tablet (2 mg) after each subsequent loose stool (not to exceed 8 tablets per day)   LORazepam 0.5 MG tablet Commonly known as:  ATIVAN Take 0.5 mg by mouth every 4 (four) hours as needed for anxiety.   metoprolol tartrate 50 MG tablet Commonly known as:  LOPRESSOR Take 50 mg by mouth daily.   ondansetron 4 MG disintegrating tablet Commonly known as:  ZOFRAN ODT 4mg  ODT q4 hours prn nausea/vomit   ONETOUCH VERIO test strip Generic drug:  glucose blood TEST EVERY DAY   OXYGEN Inhale 2-4 L into the lungs continuous.   phenytoin 100 MG ER capsule Commonly known as:  DILANTIN Take 1 capsule (100  mg total) 3 (three) times daily by mouth.   SENNA-S 8.6-50 MG tablet Generic drug:  senna-docusate Take 1 tablet by mouth daily as needed for mild constipation.   ZINC OXIDE (TOPICAL) 10 % Crea Apply 1 application topically daily as needed (redness).      Follow-up Information    Jilda Panda, MD. Schedule an appointment as soon as possible for a visit in 1 week(s).   Specialty:  Internal Medicine Why:  Hospital follow up  Contact information: [id number] [place] [address] [phone]        Melvenia Beam, MD. Schedule an appointment as soon as possible for a visit in 1 week(s).   Specialty:  Neurology Why:  New onset seizure, hospital follow up  Contact information: [place] [id number] [phone]          No Known Allergies  Consultations:  Neurology    Procedures/Studies: Dg Chest 1 View  Result Date: 12/10/2016 CLINICAL DATA:  Patient status post fall. Altered mental status. Right hip pain. EXAM: CHEST 1 VIEW COMPARISON:  Chest CT  11/10/2014. FINDINGS: Patient is rotated to the left. Cardiomegaly. Aortic atherosclerosis. Multiple monitoring leads overlie the patient. No large area of pulmonary consolidation. No pleural effusion or pneumothorax. IMPRESSION: Limited rotated portable exam. Cardiomegaly. No large area pulmonary consolidation. Electronically Signed   By: Lovey Newcomer M.D.   On: 12/10/2016 15:05   Ct Head Wo Contrast  Result Date: 12/10/2016 CLINICAL DATA:  Initial evaluation for acute trauma, fall. EXAM: CT HEAD WITHOUT CONTRAST CT CERVICAL SPINE WITHOUT CONTRAST TECHNIQUE: Multidetector CT imaging of the head and cervical spine was performed following the standard protocol without intravenous contrast. Multiplanar CT image reconstructions of the cervical spine were also generated. COMPARISON:  Prior CT from 12/14/2007. FINDINGS: CT HEAD FINDINGS Brain: Moderate cerebral atrophy with chronic small vessel ischemic disease. No acute intracranial hemorrhage. No acute large vessel territory infarct. No mass lesion, midline shift or mass effect. No hydrocephalus. No extra-axial fluid collection. Vascular: No hyperdense vessel. Scattered vascular calcifications noted within the carotid siphons. Skull: CED soft tissue contusions present at the left forehead and left periorbital region. Calvarium intact. Sinuses/Orbits: Globes and orbital soft tissues demonstrate no acute abnormality. Postsurgical changes from prior scleral banding present on the left. Patient status post cataract extraction bilaterally. Axial myopia noted. Minimal mucosal thickening noted within the ethmoidal air cells. Paranasal sinuses are otherwise clear. No mastoid effusion. Other: None. CT CERVICAL SPINE FINDINGS Alignment:  Study degraded by motion artifact. Vertebral bodies normally aligned with preservation of the normal cervical lordosis. No listhesis. Skull base and vertebrae: Skullbase intact. Mild rotation of C1 on C2 likely positional. Dens intact.  Vertebral body heights maintained. No acute fracture. Soft tissues and spinal canal: Soft tissues of the neck demonstrate no acute abnormality. No prevertebral edema. Vascular calcifications present about the carotid bifurcations. Scattered calcifications with subcentimeter hypodense nodules noted within the thyroid, of doubtful significance. Disc levels: Mild to moderate degenerate spondylolysis at C5-6 and C6-7. Upper chest: Visualized upper chest demonstrates no acute abnormality. Visualized lung apices are clear. Other: None. IMPRESSION: CT BRAIN: 1. No acute intracranial process. 2. Soft tissue contusions at the left frontal scalp and left periorbital region. Calvarium intact. 3. Moderate cerebral atrophy with chronic small vessel ischemic disease. CT CERVICAL SPINE: 1. No acute traumatic injury within cervical spine. 2. Mild-to-moderate degenerative spondylolysis at C5-6 and C6-7. Electronically Signed   By: Jeannine Boga M.D.   On: 12/10/2016  16:11   Ct Cervical Spine Wo Contrast  Result Date: 12/10/2016 CLINICAL DATA:  Initial evaluation for acute trauma, fall. EXAM: CT HEAD WITHOUT CONTRAST CT CERVICAL SPINE WITHOUT CONTRAST TECHNIQUE: Multidetector CT imaging of the head and cervical spine was performed following the standard protocol without intravenous contrast. Multiplanar CT image reconstructions of the cervical spine were also generated. COMPARISON:  Prior CT from 12/14/2007. FINDINGS: CT HEAD FINDINGS Brain: Moderate cerebral atrophy with chronic small vessel ischemic disease. No acute intracranial hemorrhage. No acute large vessel territory infarct. No mass lesion, midline shift or mass effect. No hydrocephalus. No extra-axial fluid collection. Vascular: No hyperdense vessel. Scattered vascular calcifications noted within the carotid siphons. Skull: CED soft tissue contusions present at the left forehead and left periorbital region. Calvarium intact. Sinuses/Orbits: Globes and orbital  soft tissues demonstrate no acute abnormality. Postsurgical changes from prior scleral banding present on the left. Patient status post cataract extraction bilaterally. Axial myopia noted. Minimal mucosal thickening noted within the ethmoidal air cells. Paranasal sinuses are otherwise clear. No mastoid effusion. Other: None. CT CERVICAL SPINE FINDINGS Alignment:  Study degraded by motion artifact. Vertebral bodies normally aligned with preservation of the normal cervical lordosis. No listhesis. Skull base and vertebrae: Skullbase intact. Mild rotation of C1 on C2 likely positional. Dens intact. Vertebral body heights maintained. No acute fracture. Soft tissues and spinal canal: Soft tissues of the neck demonstrate no acute abnormality. No prevertebral edema. Vascular calcifications present about the carotid bifurcations. Scattered calcifications with subcentimeter hypodense nodules noted within the thyroid, of doubtful significance. Disc levels: Mild to moderate degenerate spondylolysis at C5-6 and C6-7. Upper chest: Visualized upper chest demonstrates no acute abnormality. Visualized lung apices are clear. Other: None. IMPRESSION: CT BRAIN: 1. No acute intracranial process. 2. Soft tissue contusions at the left frontal scalp and left periorbital region. Calvarium intact. 3. Moderate cerebral atrophy with chronic small vessel ischemic disease. CT CERVICAL SPINE: 1. No acute traumatic injury within cervical spine. 2. Mild-to-moderate degenerative spondylolysis at C5-6 and C6-7. Electronically Signed   By: Jeannine Boga M.D.   On: 12/10/2016 16:11   Dg Knee Complete 4 Views Left  Result Date: 12/10/2016 CLINICAL DATA:  Patient status post fall. Knee pain. Initial encounter. EXAM: LEFT KNEE - COMPLETE 4+ VIEW COMPARISON:  None. FINDINGS: Normal anatomic alignment. Tricompartmental osteophytosis. Chondrocalcinosis. No evidence for acute fracture dislocation. Regional soft tissues are unremarkable. No joint  effusion. IMPRESSION: No acute osseous abnormality. Degenerative changes. Electronically Signed   By: Lovey Newcomer M.D.   On: 12/10/2016 15:07   Mr Brain Limited Wo Contrast  Result Date: 12/11/2016 CLINICAL DATA:  Altered level of consciousness. Fall with LEFT orbital bruising, weakness. EXAM: MRI HEAD WITHOUT CONTRAST TECHNIQUE: Axial and coronal diffusion weighted imaging obtained on a 1.5 tesla scanner. Patient terminated the examination by climbing out of the scanner. COMPARISON:  CT HEAD December 10, 2016 FINDINGS: BRAIN: No reduced diffusion to suggest acute ischemia, hypercellular tumor, typical infection. No midline shift or mass effect. No hydrocephalus. VASCULAR: Nondiagnostic. SKULL AND UPPER CERVICAL SPINE: Nondiagnostic. SINUSES/ORBITS: Nondiagnostic. OTHER: None. IMPRESSION: Limited 2 sequence MRI of the head without acute intracranial process. Electronically Signed   By: Elon Alas M.D.   On: 12/11/2016 04:40   Ct Angio Chest/abd/pel For Dissection W And/or Wo Contrast  Result Date: 12/10/2016 CLINICAL DATA:  Patient with history of abdominal aortic aneurysm. Recent fall. History of lung cancer. Vomiting. EXAM: CT ANGIOGRAPHY CHEST, ABDOMEN AND PELVIS TECHNIQUE: Multidetector CT imaging through the chest, abdomen  and pelvis was performed using the standard protocol during bolus administration of intravenous contrast. Multiplanar reconstructed images and MIPs were obtained and reviewed to evaluate the vascular anatomy. CONTRAST:  80 cc Isovue 370 COMPARISON:  CT chest 11/10/2014. FINDINGS: CTA CHEST FINDINGS Cardiovascular: Normal heart size. Coronary arterial vascular calcifications. Thoracic aortic vascular calcifications. No peripheral high attenuation within the thoracic aorta to suggest acute intramural hematoma. Mediastinum/Nodes: No enlarged axillary, mediastinal or hilar lymphadenopathy. Small hiatal hernia. Lungs/Pleura: Central airways are patent. Slight interval increase in  right paramediastinal soft tissue measuring up to 2 cm (image 44; series 7), previously 1.3 cm. Subpleural airspace opacities within the medial right lower lobe (image 45; series 2). New 11 mm ground-glass nodule within the right lower lobe (image 69; series 7). New 4 mm right lower lobe nodule (image 57; series 7). No pleural effusion or pneumothorax. Musculoskeletal: Thoracic spine degenerative changes. No aggressive or acute appearing osseous lesions. Review of the MIP images confirms the above findings. CTA ABDOMEN AND PELVIS FINDINGS VASCULAR Aorta: Bilobed abdominal aortic aneurysm originating at the level of the celiac axis and the left renal artery and extending to the distal abdominal aorta. Aneurysm sac at the level of the origin of the left renal artery measures 5.0 x 5.0 cm (image 98; series 6). This is increased from prior exam where it measured 4.3 x 4.8 cm. The inferior aspect of the bilobed aneurysm measures 4.5 x 4.6 cm, previously 2.8 x 3.0 cm. There is nonspecific fat stranding about the abdominal aortic aneurysm. Celiac: Extensive atherosclerotic narrowing at the origin and along the course of the celiac axis. SMA: Patent however narrowed at the origin with calcified atherosclerotic plaque. Renals: The right renal artery is patent however there is focal saccular aneurysm at the origin of the right renal artery which measures 1.4 cm (image 76; series 9). The left renal artery is patent. There is focal saccular outpouching at the level of the origin measuring 2.2 cm (image 82; series 9). IMA: Patent Veins: Unremarkable Review of the MIP images confirms the above findings. NON-VASCULAR Hepatobiliary: The liver is normal in size and contour. No focal hepatic lesion is identified. No intrahepatic or extrahepatic biliary ductal dilatation. Pancreas: Unchanged 2.2 cm low-attenuation lesion within the pancreatic body (image 92; series 6). Spleen: Unremarkable Adrenals/Urinary Tract: Re- demonstrated 4.2  x 2.9 cm left adrenal nodule. Right adrenal gland is normal. Kidneys enhance symmetrically with contrast. No hydronephrosis. There is a 2.3 cm partially exophytic cyst off the inferior pole the left kidney. Stomach/Bowel: Descending and sigmoid colonic diverticulosis. Mild fat stranding about the sigmoid colon. No evidence for bowel obstruction. No free fluid or free intraperitoneal air. Normal morphology of the stomach. Lymphatic: Multiple prominent aortocaval lymph nodes are demonstrated measuring up to 1.5 cm (image 116; series 6), previously 1.5 cm. No new or progressive retroperitoneal adenopathy. Reproductive: Uterus and adnexal structures are unremarkable. Other: None. Musculoskeletal: Lumbar spine degenerative changes. No aggressive or acute appearing osseous lesions. Review of the MIP images confirms the above findings. IMPRESSION: 1. There is a bilobed abdominal aortic aneurysm which originates at the level of the celiac axis and extends caudally to near the bifurcation. There is fat stranding about the aneurysm sac which is nonspecific in etiology. Given the fat stranding about the aortic aneurysm sac, impending rupture cannot be excluded. 2. Slight interval progression of airspace opacity within the medial right lung and right perihilar location. Recommend attention on follow-up to exclude the possibility of disease progression 3. Mild fat  stranding about the sigmoid colon raising the possibility of diverticulitis. 4. There a few small pulmonary nodules within the right lung as detailed above. Recommend attention on follow-up chest CT. 5. Re- demonstrated pancreatic and adrenal lesions, likely benign in etiology, recommend attention on follow-up. 6. Unchanged T6 wedge compression deformity. 7. Critical Value/emergent results were called by telephone at the time of interpretation on 12/10/2016 at 6:03 pm to Dr. Addison Lank , who verbally acknowledged these results. Electronically Signed   By: Lovey Newcomer  M.D.   On: 12/10/2016 18:15   Dg Hip Unilat With Pelvis 2-3 Views Right  Result Date: 12/10/2016 CLINICAL DATA:  Right hip pain following a fall today. EXAM: DG HIP (WITH OR WITHOUT PELVIS) 2-3V RIGHT COMPARISON:  Abdomen and pelvis CT dated 04/17/2008. FINDINGS: Diffuse osteopenia. No fracture or dislocation seen. Lower lumbar spine degenerative changes. Atheromatous arterial calcifications with a distal abdominal aortic aneurysm measuring 5.2 cm in maximum transverse diameter without correction for magnification. This measured 4.5 cm in maximum diameter 04/17/2008. IMPRESSION: 1. Infrarenal abdominal aortic aneurysm measuring 5.2 cm in maximum transverse diameter without correction for magnification. This measured 4.5 cm on 04/17/2008. Further evaluation with an abdomen and pelvis CT with contrast is recommended. 2. No hip fracture or dislocation seen. Electronically Signed   By: Claudie Revering M.D.   On: 12/10/2016 15:11    Discharge Exam: Vitals:   12/13/16 1000 12/13/16 1151  BP: (!) 134/111 115/71  Pulse:  83  Resp: (!) 32 16  Temp:  98.4 F (36.9 C)  SpO2:  100%   Vitals:   12/13/16 0800 12/13/16 0950 12/13/16 1000 12/13/16 1151  BP: 115/67 (!) 114/103 (!) 134/111 115/71  Pulse: (!) 113 (!) 116  83  Resp: (!) 25  (!) 32 16  Temp:    98.4 F (36.9 C)  TempSrc:    Oral  SpO2: 94%   100%  Weight:      Height:        General: NAD  Cardiovascular: RRR, S1/S2 +, no rubs, no gallops Respiratory: Diminished throughout, but improved from admission, no wheezing or crackles   Abdominal: Soft, NT, ND, bowel sounds + Extremities: no edema   The results of significant diagnostics from this hospitalization (including imaging, microbiology, ancillary and laboratory) are listed below for reference.     Microbiology: Recent Results (from the past 240 hour(s))  Urine Culture     Status: Abnormal   Collection Time: 12/10/16  4:23 PM  Result Value Ref Range Status   Specimen Description  URINE, CATHETERIZED  Final   Special Requests NONE  Final   Culture >=100,000 COLONIES/mL KLEBSIELLA PNEUMONIAE (A)  Final   Report Status 12/12/2016 FINAL  Final   Organism ID, Bacteria KLEBSIELLA PNEUMONIAE (A)  Final      Susceptibility   Klebsiella pneumoniae - MIC*    AMPICILLIN >=32 RESISTANT Resistant     CEFAZOLIN <=4 SENSITIVE Sensitive     CEFTRIAXONE <=1 SENSITIVE Sensitive     CIPROFLOXACIN <=0.25 SENSITIVE Sensitive     GENTAMICIN <=1 SENSITIVE Sensitive     IMIPENEM <=0.25 SENSITIVE Sensitive     NITROFURANTOIN 64 INTERMEDIATE Intermediate     TRIMETH/SULFA <=20 SENSITIVE Sensitive     AMPICILLIN/SULBACTAM 4 SENSITIVE Sensitive     PIP/TAZO <=4 SENSITIVE Sensitive     Extended ESBL NEGATIVE Sensitive     * >=100,000 COLONIES/mL KLEBSIELLA PNEUMONIAE  MRSA PCR Screening     Status: None   Collection Time: 12/10/16  9:11 PM  Result Value Ref Range Status   MRSA by PCR NEGATIVE NEGATIVE Final    Comment:        The GeneXpert MRSA Assay (FDA approved for NASAL specimens only), is one component of a comprehensive MRSA colonization surveillance program. It is not intended to diagnose MRSA infection nor to guide or monitor treatment for MRSA infections.      Labs: BNP (last 3 results) No results for input(s): BNP in the last 8760 hours. Basic Metabolic Panel: Recent Labs  Lab 12/10/16 1415 12/10/16 1429 12/11/16 0153 12/12/16 0537  NA 138 139 138 139  K 3.1* 3.1* 4.2 4.0  CL 96* 96* 98* 97*  CO2 33*  --  29 30  GLUCOSE 161* 162* 118* 114*  BUN 14 18 12 9   CREATININE 1.22* 1.20* 1.01* 0.80  CALCIUM 10.1  --  9.5 9.9   Liver Function Tests: Recent Labs  Lab 12/10/16 1415  AST 33  ALT 25  ALKPHOS 54  BILITOT 1.1  PROT 6.5  ALBUMIN 3.8   No results for input(s): LIPASE, AMYLASE in the last 168 hours. Recent Labs  Lab 12/10/16 1551  AMMONIA 47*   CBC: Recent Labs  Lab 12/10/16 1415 12/10/16 1429 12/11/16 0153 12/12/16 0537  WBC 5.8  --   6.8 8.5  NEUTROABS 3.5  --   --  6.1  HGB 11.4* 12.2 11.5* 11.9*  HCT 37.0 36.0 37.0 38.8  MCV 90.0  --  90.0 89.6  PLT 132*  --  132* 143*   Cardiac Enzymes: No results for input(s): CKTOTAL, CKMB, CKMBINDEX, TROPONINI in the last 168 hours. BNP: Invalid input(s): POCBNP CBG: No results for input(s): GLUCAP in the last 168 hours. D-Dimer No results for input(s): DDIMER in the last 72 hours. Hgb A1c No results for input(s): HGBA1C in the last 72 hours. Lipid Profile No results for input(s): CHOL, HDL, LDLCALC, TRIG, CHOLHDL, LDLDIRECT in the last 72 hours. Thyroid function studies Recent Labs    12/10/16 2221  TSH 0.204*   Anemia work up No results for input(s): VITAMINB12, FOLATE, FERRITIN, TIBC, IRON, RETICCTPCT in the last 72 hours. Urinalysis    Component Value Date/Time   COLORURINE AMBER (A) 12/10/2016 1623   APPEARANCEUR CLOUDY (A) 12/10/2016 1623   LABSPEC 1.024 12/10/2016 1623   PHURINE 5.0 12/10/2016 1623   GLUCOSEU 50 (A) 12/10/2016 1623   HGBUR SMALL (A) 12/10/2016 1623   BILIRUBINUR NEGATIVE 12/10/2016 1623   KETONESUR 20 (A) 12/10/2016 1623   PROTEINUR 100 (A) 12/10/2016 1623   UROBILINOGEN 1.0 03/09/2008 1105   NITRITE POSITIVE (A) 12/10/2016 1623   LEUKOCYTESUR LARGE (A) 12/10/2016 1623   Sepsis Labs Invalid input(s): PROCALCITONIN,  WBC,  LACTICIDVEN Microbiology Recent Results (from the past 240 hour(s))  Urine Culture     Status: Abnormal   Collection Time: 12/10/16  4:23 PM  Result Value Ref Range Status   Specimen Description URINE, CATHETERIZED  Final   Special Requests NONE  Final   Culture >=100,000 COLONIES/mL KLEBSIELLA PNEUMONIAE (A)  Final   Report Status 12/12/2016 FINAL  Final   Organism ID, Bacteria KLEBSIELLA PNEUMONIAE (A)  Final      Susceptibility   Klebsiella pneumoniae - MIC*    AMPICILLIN >=32 RESISTANT Resistant     CEFAZOLIN <=4 SENSITIVE Sensitive     CEFTRIAXONE <=1 SENSITIVE Sensitive     CIPROFLOXACIN <=0.25  SENSITIVE Sensitive     GENTAMICIN <=1 SENSITIVE Sensitive     IMIPENEM <=0.25 SENSITIVE  Sensitive     NITROFURANTOIN 64 INTERMEDIATE Intermediate     TRIMETH/SULFA <=20 SENSITIVE Sensitive     AMPICILLIN/SULBACTAM 4 SENSITIVE Sensitive     PIP/TAZO <=4 SENSITIVE Sensitive     Extended ESBL NEGATIVE Sensitive     * >=100,000 COLONIES/mL KLEBSIELLA PNEUMONIAE  MRSA PCR Screening     Status: None   Collection Time: 12/10/16  9:11 PM  Result Value Ref Range Status   MRSA by PCR NEGATIVE NEGATIVE Final    Comment:        The GeneXpert MRSA Assay (FDA approved for NASAL specimens only), is one component of a comprehensive MRSA colonization surveillance program. It is not intended to diagnose MRSA infection nor to guide or monitor treatment for MRSA infections.     Time coordinating discharge: 32 minutes  SIGNED:  Chipper Oman, MD  [facility] 12/13/2016, 2:33 PM  Pager please text page via  [email or web address] Password [email or web address]

## 2016-12-13 NOTE — Progress Notes (Signed)
1915 Bedside shift report, pt resting in bed, NAD, lines, tubes checked and verified, husband at bedside. Will continue to monitor.   2000 Pt assessed, see flow sheet, pt with confusion, reoriented, updated pt and spouse about POC. VSS, pt denies pain and SOB. WCTM.   0015 Pt sleeping comfortably, NAD, fall precautions in place.

## 2016-12-13 NOTE — Progress Notes (Signed)
Patient discharged home via EMS. AVS, prescriptions given to EMS to be given to patient's husband.

## 2016-12-13 NOTE — Progress Notes (Signed)
[facility] GIP RN visit at 0815am  This is a related and covered GIP admission of 12/10/16 with [facility] diagnosis of COPD, per Dr. Earlie Counts. Patient has [place] DNR, though it did not follow patient during transport with [facility].  She is now a DNR in the hospital. Spouse activated EMS after a fall in the home. He heard patient yell out and heard her fall and found her in the floor. Spouse reports he believes she may have stood up to go to the bathroom and wheelchair was pushed out from under her. Hospice was called, but spouse did not wait for phone call to be returned. Patient was admitted to hospital for acute encephalopathy.  Visited with patient at bedside this morning.  Patient was alert and oriented to self.  Patient was very conversant this morning.  Patient denies any pain, but says "everybody keeps asking me that question".  Patient has significant bruising and lac to L eye with bruising all over L arm.  She remembered my name from the time I introduced myself and repeated to me when I left.  Bedside RN was in the room and advised that she thought patient would move to regular room today, but has seen no transfer orders yet.  Patient continues to not eat very much, per RN.  They did try to give her Ensure, but she didn't want this morning.  They are going to try applesauce later in the morning.    Patient receiving:  Enoxaparin (LOVENOX) injection 40 mg, Dose 40, Q24H via [place], feeding supplement (ENSURE ENLIVE) liquid 237 mL, Dose 237 mL, BID between meals via PO, phenytoin (DILANTIN) ER capsule 100 mg, Dose 100 mg, TID via PO.  Continuous medications:  cefTRIAXone (ROCEPHIN) 1 g in dextrose 5% 50 mL IVPB, Dose 1 g, Q24H via IV.  PRN medications received today:  None at this time.   [facility] will continue to follow while in the hospital and anticipate any discharge needs.  Please feel free to contact with any hospice-related questions or concerns.  Thank  you, Edyth Gunnels, RN, [facility] Liaison [phone]  [place] are on [facility]ION.

## 2017-01-07 DEATH — deceased
